# Patient Record
Sex: Female | Born: 1985 | Race: White | Hispanic: Yes | Marital: Married | Smoking: Never smoker
Health system: Southern US, Community
[De-identification: ages and names within clinical notes are randomized; demographics above are authoritative.]

## PROBLEM LIST (undated history)

## (undated) DIAGNOSIS — E669 Obesity, unspecified: Secondary | ICD-10-CM

## (undated) DIAGNOSIS — E559 Vitamin D deficiency, unspecified: Secondary | ICD-10-CM

## (undated) DIAGNOSIS — O24419 Gestational diabetes mellitus in pregnancy, unspecified control: Secondary | ICD-10-CM

## (undated) DIAGNOSIS — Z789 Other specified health status: Secondary | ICD-10-CM

## (undated) HISTORY — DX: Vitamin D deficiency, unspecified: E55.9

## (undated) HISTORY — DX: Obesity, unspecified: E66.9

## (undated) HISTORY — DX: Other specified health status: Z78.9

## (undated) HISTORY — PX: DILATION AND CURETTAGE OF UTERUS: SHX78

---

## 2019-05-08 NOTE — L&D Delivery Note (Signed)
Delivery Note At  a viable female was delivered via  (Presentation:OA      ).  APGAR:9 ,9 ; weight pending  .   Placenta status:complete  ,  . 3V Cord:   with the following complications: None  .   Anesthesia:  Epidural Episiotomy:  None Lacerations:  None Suture Repair: NA Est. Blood Loss (mL):    Mom to postpartum.  Baby to Couplet care / Skin to Skin.  Pamela Riggs 04/28/2020, 11:53 AM

## 2020-01-21 ENCOUNTER — Other Ambulatory Visit: Payer: Self-pay

## 2020-01-27 ENCOUNTER — Other Ambulatory Visit: Payer: Self-pay | Admitting: Obstetrics and Gynecology

## 2020-01-27 DIAGNOSIS — Z363 Encounter for antenatal screening for malformations: Secondary | ICD-10-CM

## 2020-02-05 ENCOUNTER — Encounter: Payer: Self-pay | Admitting: *Deleted

## 2020-02-09 ENCOUNTER — Ambulatory Visit: Payer: Medicaid Other

## 2020-02-09 ENCOUNTER — Other Ambulatory Visit: Payer: Self-pay | Admitting: *Deleted

## 2020-02-09 ENCOUNTER — Ambulatory Visit: Payer: Medicaid Other | Attending: Obstetrics and Gynecology

## 2020-02-09 ENCOUNTER — Other Ambulatory Visit: Payer: Self-pay

## 2020-02-09 ENCOUNTER — Ambulatory Visit: Payer: Medicaid Other | Admitting: *Deleted

## 2020-02-09 VITALS — BP 126/72 | HR 85 | Ht 62.0 in

## 2020-02-09 DIAGNOSIS — Z363 Encounter for antenatal screening for malformations: Secondary | ICD-10-CM | POA: Insufficient documentation

## 2020-02-09 DIAGNOSIS — O280 Abnormal hematological finding on antenatal screening of mother: Secondary | ICD-10-CM | POA: Diagnosis present

## 2020-02-09 DIAGNOSIS — Z362 Encounter for other antenatal screening follow-up: Secondary | ICD-10-CM

## 2020-02-10 ENCOUNTER — Other Ambulatory Visit: Payer: Self-pay

## 2020-02-11 ENCOUNTER — Ambulatory Visit: Payer: Self-pay | Admitting: Obstetrics and Gynecology

## 2020-02-11 ENCOUNTER — Other Ambulatory Visit: Payer: Self-pay

## 2020-02-11 ENCOUNTER — Ambulatory Visit: Payer: Medicaid Other | Attending: Obstetrics and Gynecology

## 2020-02-11 DIAGNOSIS — O281 Abnormal biochemical finding on antenatal screening of mother: Secondary | ICD-10-CM | POA: Diagnosis not present

## 2020-02-11 NOTE — Progress Notes (Signed)
Referring provider:  [facility] Length of consultation:  30 minutes  [name] was referred for genetic counseling to review the results of Panorama cell free fetal DNA testing which revealed atypical fndings.  The counseling was provided by telephone, and I verified that I am speaking with the correct person using two identifiers. I discussed the limitations of evaluation and management by telemedicine. The patient expressed understanding and agreed to proceed.  [name] had Panorama Non-Invasive Prenatal Screening (NIPS) through [facility] that demonstrated an atypical finding on the sex chromosomes suspected to be maternal in origin. We reviewed that NIPS analyzes cell-free fetal DNA from the placenta found in the maternal bloodstream during pregnancy. Therefore, the sample examined contains both maternal and placental DNA.  When there is a difference in the maternal chromosomes, it may impede the interpretation of results specific to the pregnancy.  Based on the report from this sample, we discussed that there are several possibilities to explain her atypical NIPS result including a numeric or structural sex chromosome condition in the patient, mosaicism for a sex chromosome difference in the patient, copy number variant in the patient or a normal variation.  In order to fully assess these possibilities, we would recommend a chromosome analysis on maternal blood with a reflex to maternal chromosomal microarray if those results are normal.  This could more fully determine if there is a risk of a clinically significant chromosome difference in this pregnancy or other family members.  Parents with chromosome differences may be at increased risk to pass those on to a child, but that chance and any possible health or developmental concerns would be dependent upon the exact findings in the parent.    In addition, we reviewed the option of amniocentesis in this pregnancy for chromosome analysis and  chromosomal microarray.  However, interpreting this type of testing would be more informative once the results of maternal testing become available.  [name] declined amniocentesis at this time and plans to have her blood drawn for chromosome analysis with reflex to microarray at her next visit to our clinic.  We also obtained a detailed family history and pregnancy history.  This is the third pregnancy for the patient and her partner.  They have a healthy 81 year old son with normal development.  She also had a 12 week miscarriage. The patient has a healthy sister who has three sons who are developing normally and had one miscarriage. She has a full brother who is 54 years old with developmental disabilities.  He was able to attend school, but cannot live or work independently.  This brother was born about a month early and has no birth defects or health concerns. The mother states that his delays are the result of lack of care at birth, however, the patient is not aware of any medical/genetic testing to help determine the cause.  The patient also had two other brothers, one who was stillborn and one who passed away soon after birth.  It is unclear the cause for these losses, as this is not a subject that her mother is willing to discuss.  Of note, the patient's mother is one of approximately 12 siblings.  There may have also been pregnancy losses in this generation, but no details are known.  There is one female maternal first cousin with possible developmental delays, though he is able to live independently. Our patient recalls her mother stating that she may have "something that she could pass on to girls"  however, no details on this are known. The history of pregnancy losses and developmental delays could certainly be suggestive of a chromosome translocation or other condition in the family, though chromosome testing on [name] will provide more clarity on this. If any additional medical information is  available, we are happy to review this as well.  [name] had an ultrasound at [facility] on 02/09/20 at [redacted] weeks gestation.  No anomalies were noted at that time and the fetal gender appeared to be female. She will return to this clinic for another ultrasound on 03/08/20 and will have labs drawn at that time for Chromosome analysis with reflex to microarray.  We appreciate being involved in the care of this patient and may be reached at [phone] with any questions.  Cherly Anderson, MS, CGC

## 2020-03-08 ENCOUNTER — Encounter: Payer: Self-pay | Admitting: *Deleted

## 2020-03-08 ENCOUNTER — Ambulatory Visit: Payer: Medicaid Other | Attending: Obstetrics and Gynecology

## 2020-03-08 ENCOUNTER — Ambulatory Visit: Payer: Medicaid Other | Admitting: *Deleted

## 2020-03-08 ENCOUNTER — Other Ambulatory Visit: Payer: Self-pay

## 2020-03-08 VITALS — BP 102/73 | HR 96

## 2020-03-08 DIAGNOSIS — Z6841 Body Mass Index (BMI) 40.0 and over, adult: Secondary | ICD-10-CM | POA: Diagnosis present

## 2020-03-08 DIAGNOSIS — Z3A30 30 weeks gestation of pregnancy: Secondary | ICD-10-CM

## 2020-03-08 DIAGNOSIS — Z362 Encounter for other antenatal screening follow-up: Secondary | ICD-10-CM | POA: Insufficient documentation

## 2020-03-08 DIAGNOSIS — Z363 Encounter for antenatal screening for malformations: Secondary | ICD-10-CM

## 2020-03-08 DIAGNOSIS — Z8489 Family history of other specified conditions: Secondary | ICD-10-CM

## 2020-03-08 DIAGNOSIS — O285 Abnormal chromosomal and genetic finding on antenatal screening of mother: Secondary | ICD-10-CM

## 2020-03-08 DIAGNOSIS — O24410 Gestational diabetes mellitus in pregnancy, diet controlled: Secondary | ICD-10-CM

## 2020-03-08 DIAGNOSIS — O99210 Obesity complicating pregnancy, unspecified trimester: Secondary | ICD-10-CM | POA: Diagnosis not present

## 2020-03-09 ENCOUNTER — Other Ambulatory Visit: Payer: Self-pay | Admitting: *Deleted

## 2020-03-09 DIAGNOSIS — O24419 Gestational diabetes mellitus in pregnancy, unspecified control: Secondary | ICD-10-CM

## 2020-03-18 LAB — CHROMOSOME RFX TO MICROARRAY
Cells Analyzed: 20
Cells Counted: 30
Cells Karyotyped: 2
GTG Band Resolution Achieved: 550

## 2020-03-21 ENCOUNTER — Telehealth: Payer: Self-pay | Admitting: Obstetrics and Gynecology

## 2020-03-21 NOTE — Telephone Encounter (Signed)
I spoke with [name] today with the results of her chromosome analysis which were drawn due to a suspected maternal finding on the Panorama cell free fetal DNA testing.  The results were reported as follows: 45,X [12]/46,XX[18].  This means that 60% of the cells examined showed normal female karyotype while 40% of the cells showed a missing sex chromosome.  This is consistent with mosaic Turner syndrome.    We reviewed that some natural loss of an X chromosome is normal as women age, but that this is expected to be present in less than 10% of cells.  When a higher percentage is noted, the diagnosis is consistent with mosaicism.  Persons with mosaic Turner syndrome may have a highly variable phenotype, from features consistent with Turner syndrome to no findings of the condition.  This is dependent upon how many cells and in what tissues the abnormal cell line is found. Features of Turner syndrome include short stature, infertility due to gonadal dysgenesis/premature ovarian failure, webbed neck, low hairline, broad chest, cardiac anomalies, kidney malformations and learning differences. Testing another cell type, such as a buccal swab, may provide additional information.  As for this pregnancy, which appears to be female by ultrasound, we would expect a low chance for a sex chromosome condition.  However, women with mosaicism involving the gonadal tissue may be at increased risk to have a female fetus with Turner syndrome. Greater than 98% of pregnancies conceived with Turner syndrome result in miscarriage, and we cannot comment on the possible karyotype of the documented pregnancy loss for [name] or the reported other two positive pregnancy tests that were followed by heavy bleeding prior to medical confirmation of pregnancy. Of note, [name] stated that she is 5'2 (her sister is 20'4, mother is 53'5 and father is 5'7).  She had normal onset of periods in middle school with no learning differences.  During  her pregnancy with her son, she was referred for a cardiology evaluation due to chest pain and the evaluation was normal per her report.    I also inquired if she learned anything further about the condition her mother told her "she may pass on to girls" and she reported that she spoke with her sister, who said that the sister and their mother both have "2 chromosomes that were switched" and could cause problems in a baby.  It is not clear what brought her sister to have this testing and the patient does not recall which chromosomes were involved.  She plans to speak with her sister and try to obtain documentation of the change so that we could consider additional testing if needed.  No other numeric or structural chromosome conditions were reported on the recent chromosome analysis, but we have requested that the lab retain the sample until more is learned about this history of a possible chromosome translocation.  I will speak with Medical Genetics at the [facility] meeting tomorrow regarding recommendations for follow up evaluations for this patient and will let her know the plans after that.  We may be reached at [phone].  Pamela Anderson, MS, CGC

## 2020-03-22 ENCOUNTER — Encounter: Payer: Self-pay | Admitting: Obstetrics and Gynecology

## 2020-04-05 ENCOUNTER — Ambulatory Visit: Payer: Medicaid Other

## 2020-04-07 ENCOUNTER — Telehealth: Payer: Self-pay | Admitting: Obstetrics and Gynecology

## 2020-04-07 NOTE — Telephone Encounter (Signed)
I spoke with Ms. Leticia Clas to follow up on the possible chromosome translocation she mentioned in her sister.  She stated today that she asked and the sister thought it may "have involved chromosomes 8, 16 or 19". I explained that we would be happy to send her a record release to obtain documentation of these lab results, but she also stated that the sister didn't remember where she was seen for that testing.  We agreed that the patient would contact our clinic if she desires for [place] to provide a form to obtain records on her sister.  I also explained that while her chromosome analysis did not show evidence of a structural chromosome rearrangement, this cannot always be identified without additional testing.  Lastly, I let the patient know that we will be sending records to [facility] for them to contact her for an appointment in the [facility] with Dr. Abelino Derrick in the near future.    We may be reached at [phone].  Cherly Anderson, MS, CGC

## 2020-04-13 ENCOUNTER — Telehealth: Payer: Self-pay

## 2020-04-13 NOTE — Telephone Encounter (Signed)
Spoke with patient and she cancelled her upcoming follow up and bpp appointments. She stated that her OB said that she didn't need them anymore.   Routing to MD's to make aware.

## 2020-04-15 ENCOUNTER — Ambulatory Visit: Payer: Medicaid Other

## 2020-04-19 ENCOUNTER — Ambulatory Visit: Payer: Medicaid Other

## 2020-04-25 ENCOUNTER — Other Ambulatory Visit: Payer: Self-pay

## 2020-04-25 ENCOUNTER — Encounter (HOSPITAL_COMMUNITY): Payer: Self-pay | Admitting: Obstetrics and Gynecology

## 2020-04-25 ENCOUNTER — Inpatient Hospital Stay (HOSPITAL_COMMUNITY)
Admission: AD | Admit: 2020-04-25 | Discharge: 2020-04-29 | DRG: 807 | Disposition: A | Discharge status: Home / Self Care | Payer: Medicaid Other | Attending: Obstetrics & Gynecology | Admitting: Obstetrics & Gynecology

## 2020-04-25 DIAGNOSIS — Z23 Encounter for immunization: Secondary | ICD-10-CM | POA: Diagnosis not present

## 2020-04-25 DIAGNOSIS — F419 Anxiety disorder, unspecified: Secondary | ICD-10-CM | POA: Diagnosis present

## 2020-04-25 DIAGNOSIS — Z3A37 37 weeks gestation of pregnancy: Secondary | ICD-10-CM | POA: Diagnosis not present

## 2020-04-25 DIAGNOSIS — O99214 Obesity complicating childbirth: Secondary | ICD-10-CM | POA: Diagnosis present

## 2020-04-25 DIAGNOSIS — O99213 Obesity complicating pregnancy, third trimester: Secondary | ICD-10-CM | POA: Diagnosis not present

## 2020-04-25 DIAGNOSIS — Z3490 Encounter for supervision of normal pregnancy, unspecified, unspecified trimester: Secondary | ICD-10-CM | POA: Diagnosis present

## 2020-04-25 DIAGNOSIS — Z20822 Contact with and (suspected) exposure to covid-19: Secondary | ICD-10-CM | POA: Diagnosis present

## 2020-04-25 DIAGNOSIS — O1214 Gestational proteinuria, complicating childbirth: Secondary | ICD-10-CM | POA: Diagnosis present

## 2020-04-25 DIAGNOSIS — Z3A36 36 weeks gestation of pregnancy: Secondary | ICD-10-CM

## 2020-04-25 DIAGNOSIS — O1490 Unspecified pre-eclampsia, unspecified trimester: Secondary | ICD-10-CM

## 2020-04-25 DIAGNOSIS — O99344 Other mental disorders complicating childbirth: Secondary | ICD-10-CM | POA: Diagnosis present

## 2020-04-25 DIAGNOSIS — Q963 Mosaicism, 45, X/46, XX or XY: Secondary | ICD-10-CM

## 2020-04-25 DIAGNOSIS — R112 Nausea with vomiting, unspecified: Secondary | ICD-10-CM | POA: Diagnosis present

## 2020-04-25 DIAGNOSIS — O24425 Gestational diabetes mellitus in childbirth, controlled by oral hypoglycemic drugs: Principal | ICD-10-CM | POA: Diagnosis present

## 2020-04-25 DIAGNOSIS — O212 Late vomiting of pregnancy: Secondary | ICD-10-CM | POA: Diagnosis present

## 2020-04-25 DIAGNOSIS — O24435 Gestational diabetes mellitus in puerperium, controlled by oral hypoglycemic drugs: Secondary | ICD-10-CM | POA: Diagnosis present

## 2020-04-25 DIAGNOSIS — O1213 Gestational proteinuria, third trimester: Secondary | ICD-10-CM | POA: Diagnosis present

## 2020-04-25 DIAGNOSIS — O24415 Gestational diabetes mellitus in pregnancy, controlled by oral hypoglycemic drugs: Secondary | ICD-10-CM | POA: Diagnosis not present

## 2020-04-25 LAB — COMPREHENSIVE METABOLIC PANEL
ALT: 14 U/L (ref 0–44)
AST: 18 U/L (ref 15–41)
Albumin: 2.8 g/dL — ABNORMAL LOW (ref 3.5–5.0)
Alkaline Phosphatase: 124 U/L (ref 38–126)
Anion gap: 14 (ref 5–15)
BUN: 6 mg/dL (ref 6–20)
CO2: 16 mmol/L — ABNORMAL LOW (ref 22–32)
Calcium: 9.1 mg/dL (ref 8.9–10.3)
Chloride: 103 mmol/L (ref 98–111)
Creatinine, Ser: 0.81 mg/dL (ref 0.44–1.00)
GFR, Estimated: >60 mL/min (ref >60)
Glucose, Bld: 92 mg/dL (ref 70–99)
Potassium: 4.3 mmol/L (ref 3.5–5.1)
Sodium: 133 mmol/L — ABNORMAL LOW (ref 135–145)
Total Bilirubin: 1.4 mg/dL — ABNORMAL HIGH (ref 0.3–1.2)
Total Protein: 6.9 g/dL (ref 6.5–8.1)

## 2020-04-25 LAB — CBC
HCT: 38.9 % (ref 36.0–46.0)
HCT: 35.1 % — ABNORMAL LOW (ref 36.0–46.0)
Hemoglobin: 13.3 g/dL (ref 12.0–15.0)
Hemoglobin: 11.7 g/dL — ABNORMAL LOW (ref 12.0–15.0)
MCH: 30.5 pg (ref 26.0–34.0)
MCH: 29.7 pg (ref 26.0–34.0)
MCHC: 34.2 g/dL (ref 30.0–36.0)
MCHC: 33.3 g/dL (ref 30.0–36.0)
MCV: 89.2 fL (ref 80.0–100.0)
MCV: 89.1 fL (ref 80.0–100.0)
Platelets: 218 10*3/uL (ref 150–400)
Platelets: 176 10*3/uL (ref 150–400)
RBC: 4.36 MIL/uL (ref 3.87–5.11)
RBC: 3.94 MIL/uL (ref 3.87–5.11)
RDW: 13.1 % (ref 11.5–15.5)
RDW: 13.1 % (ref 11.5–15.5)
WBC: 10.8 10*3/uL — ABNORMAL HIGH (ref 4.0–10.5)
WBC: 9.2 10*3/uL (ref 4.0–10.5)
nRBC: 0 % (ref 0.0–0.2)
nRBC: 0 % (ref 0.0–0.2)

## 2020-04-25 LAB — URINALYSIS, ROUTINE W REFLEX MICROSCOPIC
Bacteria, UA: NONE SEEN
Bilirubin Urine: NEGATIVE
Glucose, UA: NEGATIVE mg/dL
Hgb urine dipstick: NEGATIVE
Ketones, ur: 80 mg/dL — AB
Nitrite: NEGATIVE
Protein, ur: 100 mg/dL — AB
Specific Gravity, Urine: 1.02 (ref 1.005–1.030)
pH: 5 (ref 5.0–8.0)

## 2020-04-25 LAB — TYPE AND SCREEN
ABO/RH(D): A POS
Antibody Screen: NEGATIVE

## 2020-04-25 LAB — RESP PANEL BY RT-PCR (FLU A&B, COVID) ARPGX2
Influenza A by PCR: NEGATIVE
Influenza B by PCR: NEGATIVE
SARS Coronavirus 2 by RT PCR: NEGATIVE

## 2020-04-25 LAB — GLUCOSE, CAPILLARY
Glucose-Capillary: 90 mg/dL (ref 70–99)
Glucose-Capillary: 92 mg/dL (ref 70–99)
Glucose-Capillary: 85 mg/dL (ref 70–99)

## 2020-04-25 LAB — PROTEIN / CREATININE RATIO, URINE
Creatinine, Urine: 163.72 mg/dL
Protein Creatinine Ratio: 0.48 mg/mg{Cre} — ABNORMAL HIGH (ref 0.00–0.15)
Total Protein, Urine: 79 mg/dL

## 2020-04-25 LAB — BETA-HYDROXYBUTYRIC ACID: Beta-Hydroxybutyric Acid: 2.62 mmol/L — ABNORMAL HIGH (ref 0.05–0.27)

## 2020-04-25 LAB — GROUP B STREP BY PCR: Group B strep by PCR: NEGATIVE

## 2020-04-25 MED ORDER — PROMETHAZINE HCL 25 MG/ML IJ SOLN
12.5000 mg | Freq: Once | INTRAMUSCULAR | Status: AC
  Administered 2020-04-25: 15:00:00 12.5 mg via INTRAVENOUS
  Filled 2020-04-25: qty 1

## 2020-04-25 MED ORDER — PRENATAL MULTIVITAMIN CH
1.0000 | ORAL_TABLET | Freq: Every day | ORAL | Status: DC
  Administered 2020-04-26 – 2020-04-27 (×2): 1 via ORAL
  Filled 2020-04-25 (×2): qty 1

## 2020-04-25 MED ORDER — DEXTROSE IN LACTATED RINGERS 5 % IV SOLN: INTRAVENOUS | Status: DC

## 2020-04-25 MED ORDER — LACTATED RINGERS IV BOLUS
1000.0000 mL | Freq: Once | INTRAVENOUS | Status: AC
  Administered 2020-04-25: 14:00:00 1000 mL via INTRAVENOUS

## 2020-04-25 MED ORDER — PROMETHAZINE HCL 25 MG/ML IJ SOLN: 25.0000 mg | Freq: Three times a day (TID) | INTRAMUSCULAR | Status: DC | PRN

## 2020-04-25 MED ORDER — CALCIUM CARBONATE ANTACID 500 MG PO CHEW
2.0000 | CHEWABLE_TABLET | ORAL | Status: DC | PRN
Start: 2020-04-25 — End: 2020-04-27

## 2020-04-25 MED ORDER — DOCUSATE SODIUM 100 MG PO CAPS
100.0000 mg | ORAL_CAPSULE | Freq: Every day | ORAL | Status: DC
  Filled 2020-04-25 (×2): qty 1

## 2020-04-25 MED ORDER — LACTATED RINGERS IV SOLN: INTRAVENOUS | Status: DC

## 2020-04-25 MED ORDER — LACTATED RINGERS IV BOLUS
1000.0000 mL | Freq: Once | INTRAVENOUS | Status: AC
  Administered 2020-04-25: 15:00:00 1000 mL via INTRAVENOUS

## 2020-04-25 MED ORDER — ZOLPIDEM TARTRATE 5 MG PO TABS: 5.0000 mg | ORAL_TABLET | Freq: Every evening | ORAL | Status: DC | PRN

## 2020-04-25 MED ORDER — ACETAMINOPHEN 325 MG PO TABS: 650.0000 mg | ORAL_TABLET | ORAL | Status: DC | PRN

## 2020-04-25 NOTE — H&P (Addendum)
OB ADMISSION/ HISTORY & PHYSICAL:  Admission Date: 04/25/2020 12:48 PM  Admit Diagnosis: Proteinurua  Pamela Riggs is a 34 y.o. female G3P1011 [redacted]w[redacted]d presenting for N/V since 04/22/20, reports emesis after every attempt at eating/drinking. Unable to tolerate solids/liquids. C/O abd pain after multiple episodes of vomiting. Reports active FM and ctx when standing, denies LOF and vaginal bleeding. 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   History of current pregnancy: G3P1011   Patient entered care with [facility] at 23+2 wks as a tx from [facility] in [place].   EDC 05/17/20 by 07/15/20 @ 6+2 wks Anatomy scan:  26 wks, complete w/ posterior placenta.   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, XX or XY 04/25/2020    Prenatal Labs: ABO, Rh:  A positive Antibody:  negative Rubella:   immune RPR:   NR HBsAg:   NR HIV:   NR GTT: 1 hr 250 GBS:   negativeGC/CHL: neg/neg Genetics: Panorama atypical sex chromosome Tdap/influenza vaccines: declined/declined   OB History  Gravida Para Term Preterm AB Living  3 1 1   1 1   SAB IAB Ectopic Multiple Live Births  1            # Outcome Date GA Lbr Len/2nd Weight Sex Delivery Anes PTL Lv  3 Current           2 Term 07/29/16 [redacted]w[redacted]d    Vag-Spont     1 SAB             Medical / Surgical History: Past medical history:  Past Medical History:  Diagnosis Date   Medical history non-contributory     Past surgical history:  Past Surgical History:  Procedure Laterality Date   DILATION AND CURETTAGE  OF UTERUS     Family History: History reviewed. No pertinent family history.  Social History:  reports that she has never smoked. She has never used smokeless tobacco. She reports that she does not drink alcohol and does not use drugs.  Allergies: Patient has no known allergies.   Current Medications at time of admission:  Prior to Admission medications   Medication Sig Start Date End Date Taking? Authorizing Provider  Prenatal Vit-Fe Fumarate-FA (PRENATAL VITAMINS PO) Take by mouth.   Yes [provider]  glyBURIDE (DIABETA) 5 MG tablet Take 5 mg by mouth 2 (two) times daily with a meal.    [provider]    Review of Systems: Constitutional: Negative   HENT: Negative   Eyes: Negative   Respiratory: Negative   Cardiovascular: Negative   Gastrointestinal: Negative  Genitourinary: neg for bloody show, neg for LOF   Musculoskeletal: Negative   Skin: Negative   Neurological: Negative   Endo/Heme/Allergies: Negative   Psychiatric/Behavioral: Negative    Physical Exam: VS: Blood pressure 132/79, pulse (!) 102, temperature 98.7 F (37.1 C), temperature source Oral, resp. rate (!) 24, height 5\' 2"  (1.575 m), weight 108.2 kg, last menstrual period 08/02/2019, SpO2 100 %. AAO x3, no signs of distress Cardiovascular: RRR Respiratory: Lung fields clear to ausculation GU/GI: Abdomen  gravid, non-tender, non-distended, active FM, vertex Extremities: trace edema, negative for pain, tenderness, and cords  Cervical exam:Dilation: Closed Effacement (%): Thick Exam by:: Pamela Riggs, CNM FHR: baseline rate 140 / variability moderate / accelerations present / absent decelerations TOCO: irreg   Prenatal Transfer Tool  Maternal Diabetes: Yes:  Diabetes Type:  Insulin/Medication controlled Genetic Screening: Abnormal:  Results: Other: Panorama-atypical sex chromosome Maternal Ultrasounds/Referrals: Normal Fetal Ultrasounds or other Referrals:  Fetal echo normal  fetal cardiac anatomy and function. Recommended fetal EGC after delivery if irregular FHR noted after birth Maternal Substance Abuse:  No Significant Maternal Medications:  Meds include: Other: Glyburide 5mg  PO BID Significant Maternal Lab Results: Other: GBS pending    Assessment: 34 y.o. G3P1011 [redacted]w[redacted]d  Proteinuria A2DM N/V in third trimester FHR category 1 GBS negative  Plan:  Admit to [facility] Routine antenatal admission orders CBG-fasting and PP 24 hr urine Advance diet as tolerate Phenergan IV PRN Glyburide 5mg  PO BID when tolerating PO NST TID  Dr [facility] notified of admission and plan of care  MSN, CNM 04/25/2020 5:58 PM

## 2020-04-25 NOTE — MAU Provider Note (Signed)
History     CSN: [id number]  Arrival date and time: 04/25/20 1248   Event Date/Time   First Provider Initiated Contact with Patient 04/25/20 1335      Chief Complaint  Patient presents with  . Abdominal Pain  . Nausea  . Emesis   HPI Pamela Riggs is 34 y.o. G3P1011 at [redacted]w[redacted]d who presents to MAU with chief complaints of nausea and vomiting in the setting of ADGDM on Glyburide BID.  She states she woke up Friday morning 04/22/2020 and experienced new onset nausea and vomiting while brushing her teeth. Throughout the weekend she was unable to tolerate anything PO and so did not take her Glyburide. She denies vaginal bleeding, leaking of fluid, decreased fetal movement, fever, falls, or recent illness.   Patient also c/o pelvic pain and lower abdominal cramping. These are recurrent problems, aggravated by movement and adopting a sitting position. She denies alleviating factors. She declines pain medication in MAU.  She denies history of elevated blood pressures. She denies headache, visual disturbances, RUQ/epigastric pain, new onset swelling or weight gain.  Patient receives care with [facility].  OB History    Gravida  3   Para  1   Term  1   Preterm      AB  1   Living  1     SAB  1   IAB      Ectopic      Multiple      Live Births              Past Medical History:  Diagnosis Date  . Medical history non-contributory     Past Surgical History:  Procedure Laterality Date  . DILATION AND CURETTAGE OF UTERUS      No family history on file.  Social History   Tobacco Use  . Smoking status: Never Smoker  . Smokeless tobacco: Never Used  Vaping Use  . Vaping Use: Never used  Substance Use Topics  . Alcohol use: Never  . Drug use: Never    Allergies: No Known Allergies  Medications Prior to Admission  Medication Sig Dispense Refill Last Dose  . Prenatal Vit-Fe Fumarate-FA (PRENATAL VITAMINS PO) Take by mouth.   04/25/2020 at Unknown time  .  glyBURIDE (DIABETA) 5 MG tablet Take 5 mg by mouth 2 (two) times daily with a meal.       Review of Systems  Gastrointestinal: Positive for abdominal pain.  Genitourinary: Positive for pelvic pain. Negative for vaginal bleeding.  All other systems reviewed and are negative.  Physical Exam   Blood pressure (!) 145/91, pulse (!) 120, temperature 98.4 F (36.9 C), temperature source Oral, resp. rate 18, height 5\' 2"  (1.575 m), weight 108.2 kg, last menstrual period 08/02/2019, SpO2 100 %.  Physical Exam Vitals and nursing note reviewed. Exam conducted with a chaperone present.  Cardiovascular:     Rate and Rhythm: Normal rate.     Heart sounds: Normal heart sounds.  Pulmonary:     Effort: Pulmonary effort is normal.     Breath sounds: Normal breath sounds.  Abdominal:     Tenderness: There is no abdominal tenderness.     Comments: Gravid  Skin:    General: Skin is warm.     Capillary Refill: Capillary refill takes less than 2 seconds.  Neurological:     Mental Status: She is alert and oriented to person, place, and time.  Psychiatric:        Mood and Affect:  Mood normal.        Behavior: Behavior normal.     MAU Course  Procedures   --Reactive tracing: baseline 140, mod var, + 15 x 15 accels, no decels --Toco: quiet --Cervix closed --Random POCT CBG of 90 on arrival --Premedicated with Phenergan, tolerating juice, CBG one hour later 92 --Management in MAU coordinated with Dr. Adrian Blackwater, admission advised. Report called to Dr. Normand Sloop, who agrees with recommendation  Orders Placed This Encounter  Procedures  . Urinalysis, Routine w reflex microscopic Urine, Clean Catch  . CBC  . Comprehensive metabolic panel  . Beta-hydroxybutyric acid  . Glucose, capillary  . Protein / creatinine ratio, urine  . Glucose, capillary  . Insert peripheral IV   Patient Vitals for the past 24 hrs:  BP Temp Temp src Pulse Resp SpO2 Height Weight  04/25/20 1546 122/72 -- -- 98 -- -- -- --   04/25/20 1531 (!) 130/99 -- -- 75 -- -- -- --  04/25/20 1516 119/69 -- -- 97 -- -- -- --  04/25/20 1501 122/68 -- -- 95 -- -- -- --  04/25/20 1446 116/71 -- -- 95 -- -- -- --  04/25/20 1431 128/82 -- -- (!) 105 -- -- -- --  04/25/20 1416 121/76 -- -- (!) 105 -- -- -- --  04/25/20 1400 (!) 113/52 -- -- (!) 101 -- 97 % -- --  04/25/20 1345 (!) 131/96 -- -- (!) 107 -- 99 % -- --  04/25/20 1328 112/75 -- -- (!) 129 -- -- -- --  04/25/20 1306 (!) 145/91 98.4 F (36.9 C) Oral (!) 120 18 100 % 5\' 2"  (1.575 m) 108.2 kg   Results for orders placed or performed during the hospital encounter of 04/25/20 (from the past 24 hour(s))  CBC     Status: Abnormal   Collection Time: 04/25/20  1:25 PM  Result Value Ref Range   WBC 10.8 (H) 4.0 - 10.5 K/uL   RBC 4.36 3.87 - 5.11 MIL/uL   Hemoglobin 13.3 12.0 - 15.0 g/dL   HCT 04/27/20 01.7 - 51.0 %   MCV 89.2 80.0 - 100.0 fL   MCH 30.5 26.0 - 34.0 pg   MCHC 34.2 30.0 - 36.0 g/dL   RDW 25.8 52.7 - 78.2 %   Platelets 218 150 - 400 K/uL   nRBC 0.0 0.0 - 0.2 %  Comprehensive metabolic panel     Status: Abnormal   Collection Time: 04/25/20  1:25 PM  Result Value Ref Range   Sodium 133 (L) 135 - 145 mmol/L   Potassium 4.3 3.5 - 5.1 mmol/L   Chloride 103 98 - 111 mmol/L   CO2 16 (L) 22 - 32 mmol/L   Glucose, Bld 92 70 - 99 mg/dL   BUN 6 6 - 20 mg/dL   Creatinine, Ser 04/27/20 0.44 - 1.00 mg/dL   Calcium 9.1 8.9 - 5.36 mg/dL   Total Protein 6.9 6.5 - 8.1 g/dL   Albumin 2.8 (L) 3.5 - 5.0 g/dL   AST 18 15 - 41 U/L   ALT 14 0 - 44 U/L   Alkaline Phosphatase 124 38 - 126 U/L   Total Bilirubin 1.4 (H) 0.3 - 1.2 mg/dL   GFR, Estimated 14.4 >31 mL/min   Anion gap 14 5 - 15  Beta-hydroxybutyric acid     Status: Abnormal   Collection Time: 04/25/20  1:25 PM  Result Value Ref Range   Beta-Hydroxybutyric Acid 2.62 (H) 0.05 - 0.27 mmol/L  Glucose, capillary  Status: None   Collection Time: 04/25/20  1:30 PM  Result Value Ref Range   Glucose-Capillary 90 70 -  99 mg/dL  Protein / creatinine ratio, urine     Status: Abnormal   Collection Time: 04/25/20  1:46 PM  Result Value Ref Range   Creatinine, Urine 163.72 mg/dL   Total Protein, Urine 79 mg/dL   Protein Creatinine Ratio 0.48 (H) 0.00 - 0.15 mg/mg[Cre]  Urinalysis, Routine w reflex microscopic Urine, Clean Catch     Status: Abnormal   Collection Time: 04/25/20  1:48 PM  Result Value Ref Range   Color, Urine AMBER (A) YELLOW   APPearance HAZY (A) CLEAR   Specific Gravity, Urine 1.020 1.005 - 1.030   pH 5.0 5.0 - 8.0   Glucose, UA NEGATIVE NEGATIVE mg/dL   Hgb urine dipstick NEGATIVE NEGATIVE   Bilirubin Urine NEGATIVE NEGATIVE   Ketones, ur 80 (A) NEGATIVE mg/dL   Protein, ur 761 (A) NEGATIVE mg/dL   Nitrite NEGATIVE NEGATIVE   Leukocytes,Ua TRACE (A) NEGATIVE   RBC / HPF 0-5 0 - 5 RBC/hpf   WBC, UA 6-10 0 - 5 WBC/hpf   Bacteria, UA NONE SEEN NONE SEEN   Squamous Epithelial / LPF 11-20 0 - 5   Mucus PRESENT    Hyaline Casts, UA PRESENT   Glucose, capillary     Status: None   Collection Time: 04/25/20  4:24 PM  Result Value Ref Range   Glucose-Capillary 92 70 - 99 mg/dL    Assessment and Plan  --34 y.o. G3P1011 at [redacted]w[redacted]d  --Proteinuria in the setting of new onset HTN --A2GDM on Glyburide 5mg  BID --Reactive tracing, closed cervix --Per Dr. , admit to [facility], CNM 04/25/2020, 5:01 PM

## 2020-04-25 NOTE — MAU Note (Signed)
This weekend, hadn't felt very good.  Hasn't been able to eat, had cereal Sat morning- was only thing she has kept down.  Ongoing nausea and vomiting.  Having pain in lower abd, pressure. Pain wakes her up every 2 hrs, gets too uncomfortable. Denies fever or diarrhea.  Contracts when she sits down- more regular when sitting.

## 2020-04-26 LAB — URINE CULTURE

## 2020-04-26 LAB — GLUCOSE, CAPILLARY
Glucose-Capillary: 76 mg/dL (ref 70–99)
Glucose-Capillary: 77 mg/dL (ref 70–99)
Glucose-Capillary: 77 mg/dL (ref 70–99)

## 2020-04-26 LAB — PROTEIN, URINE, 24 HOUR
Collection Interval-UPROT: 24 hours
Protein, 24H Urine: 462 mg/d — ABNORMAL HIGH (ref 50–100)
Protein, Urine: 22 mg/dL
Urine Total Volume-UPROT: 2100 mL

## 2020-04-26 NOTE — Progress Notes (Signed)
Hospital day # 1 pregnancy at [redacted]w[redacted]d--GDMA2  S:  Pt feeling better.  Denies vomiting.  Has only tried eating soup.  Is not taking Glyburide in hospital and is having random normal sugars.  Denies headache, blurred vision and epigastric pain.  FM+, Denies contractions or LOF    O: BP 121/66 (BP Location: Right Arm)    Pulse 96    Temp 98 F (36.7 C) (Oral)    Resp 18    Ht 5\' 2"  (1.575 m)    Wt 108.2 kg    LMP 08/02/2019 (Approximate)    SpO2 100%    BMI 43.64 kg/m   Vitals with BMI 04/26/2020 04/26/2020 04/26/2020  Height - - -  Weight - - -  BMI - - -  Systolic 121 116 04/28/2020  Diastolic 66 79 75  Pulse 96 89 89        Fetal tracings: FHT 130s accels noted, no decels.      Contractions:  irregular      Uterus gravid and non-tender      Extremities: extremities normal, atraumatic, no cyanosis or edema and no significant edema and no signs of DVT          Labs:   Results for orders placed or performed during the hospital encounter of 04/25/20 (from the past 24 hour(s))  Glucose, capillary     Status: None   Collection Time: 04/25/20  8:35 PM  Result Value Ref Range   Glucose-Capillary 85 70 - 99 mg/dL  Glucose, capillary     Status: None   Collection Time: 04/26/20 12:52 AM  Result Value Ref Range   Glucose-Capillary 77 70 - 99 mg/dL  Glucose, capillary     Status: None   Collection Time: 04/26/20  4:46 AM  Result Value Ref Range   Glucose-Capillary 77 70 - 99 mg/dL  Glucose, capillary     Status: None   Collection Time: 04/26/20  8:57 AM  Result Value Ref Range   Glucose-Capillary 76 70 - 99 mg/dL   24 hour urine for protein pending       Meds: Glyburide 5 mg BID on hold  A: [redacted]w[redacted]d with GDMA2 and proteinuria     stable  P: Continue current plan of care      Upcoming tests/treatments:  MFM consult in am for poc       MDs will follow  [redacted]w[redacted]d CNM, MSN 04/26/2020 7:41 PM

## 2020-04-26 NOTE — Plan of Care (Signed)
  Problem: Education: Goal: Knowledge of General Education information will improve Description: Including pain rating scale, medication(s)/side effects and non-pharmacologic comfort measures Outcome: Completed/Met

## 2020-04-27 ENCOUNTER — Inpatient Hospital Stay (HOSPITAL_COMMUNITY): Payer: Medicaid Other

## 2020-04-27 ENCOUNTER — Inpatient Hospital Stay (HOSPITAL_COMMUNITY): Payer: Medicaid Other | Admitting: Anesthesiology

## 2020-04-27 DIAGNOSIS — O24415 Gestational diabetes mellitus in pregnancy, controlled by oral hypoglycemic drugs: Secondary | ICD-10-CM

## 2020-04-27 DIAGNOSIS — Z3A37 37 weeks gestation of pregnancy: Secondary | ICD-10-CM

## 2020-04-27 DIAGNOSIS — Z3490 Encounter for supervision of normal pregnancy, unspecified, unspecified trimester: Secondary | ICD-10-CM | POA: Diagnosis present

## 2020-04-27 DIAGNOSIS — O99213 Obesity complicating pregnancy, third trimester: Secondary | ICD-10-CM

## 2020-04-27 LAB — CBC
HCT: 35.6 % — ABNORMAL LOW (ref 36.0–46.0)
Hemoglobin: 12.4 g/dL (ref 12.0–15.0)
MCH: 31.1 pg (ref 26.0–34.0)
MCHC: 34.8 g/dL (ref 30.0–36.0)
MCV: 89.2 fL (ref 80.0–100.0)
Platelets: 203 10*3/uL (ref 150–400)
RBC: 3.99 MIL/uL (ref 3.87–5.11)
RDW: 13.2 % (ref 11.5–15.5)
WBC: 5.8 10*3/uL (ref 4.0–10.5)
nRBC: 0 % (ref 0.0–0.2)

## 2020-04-27 LAB — GLUCOSE, CAPILLARY
Glucose-Capillary: 58 mg/dL — ABNORMAL LOW (ref 70–99)
Glucose-Capillary: 65 mg/dL — ABNORMAL LOW (ref 70–99)
Glucose-Capillary: 98 mg/dL (ref 70–99)
Glucose-Capillary: 81 mg/dL (ref 70–99)
Glucose-Capillary: 67 mg/dL — ABNORMAL LOW (ref 70–99)
Glucose-Capillary: 89 mg/dL (ref 70–99)
Glucose-Capillary: 82 mg/dL (ref 70–99)

## 2020-04-27 LAB — COMPREHENSIVE METABOLIC PANEL
ALT: 11 U/L (ref 0–44)
AST: 16 U/L (ref 15–41)
Albumin: 2.4 g/dL — ABNORMAL LOW (ref 3.5–5.0)
Alkaline Phosphatase: 132 U/L — ABNORMAL HIGH (ref 38–126)
Anion gap: 14 (ref 5–15)
BUN: <5 mg/dL — ABNORMAL LOW (ref 6–20)
CO2: 13 mmol/L — ABNORMAL LOW (ref 22–32)
Calcium: 9.1 mg/dL (ref 8.9–10.3)
Chloride: 108 mmol/L (ref 98–111)
Creatinine, Ser: 0.55 mg/dL (ref 0.44–1.00)
GFR, Estimated: >60 mL/min (ref >60)
Glucose, Bld: 71 mg/dL (ref 70–99)
Potassium: 3.8 mmol/L (ref 3.5–5.1)
Sodium: 135 mmol/L (ref 135–145)
Total Bilirubin: 1.1 mg/dL (ref 0.3–1.2)
Total Protein: 5.9 g/dL — ABNORMAL LOW (ref 6.5–8.1)

## 2020-04-27 MED ORDER — FENTANYL CITRATE (PF) 100 MCG/2ML IJ SOLN: 50.0000 ug | INTRAMUSCULAR | Status: DC | PRN

## 2020-04-27 MED ORDER — LIDOCAINE HCL (PF) 1 % IJ SOLN: 30.0000 mL | INTRAMUSCULAR | Status: DC | PRN

## 2020-04-27 MED ORDER — SODIUM CHLORIDE (PF) 0.9 % IJ SOLN
INTRAMUSCULAR | Status: DC | PRN
  Administered 2020-04-27: 12 mL/h via EPIDURAL

## 2020-04-27 MED ORDER — LACTATED RINGERS IV SOLN: INTRAVENOUS | Status: DC

## 2020-04-27 MED ORDER — ACETAMINOPHEN 325 MG PO TABS: 650.0000 mg | ORAL_TABLET | ORAL | Status: DC | PRN

## 2020-04-27 MED ORDER — PHENYLEPHRINE 40 MCG/ML (10ML) SYRINGE FOR IV PUSH (FOR BLOOD PRESSURE SUPPORT): 80.0000 ug | PREFILLED_SYRINGE | INTRAVENOUS | Status: DC | PRN

## 2020-04-27 MED ORDER — OXYTOCIN-SODIUM CHLORIDE 30-0.9 UT/500ML-% IV SOLN
1.0000 m[IU]/min | INTRAVENOUS | Status: DC
  Administered 2020-04-27: 17:00:00 2 m[IU]/min via INTRAVENOUS
  Filled 2020-04-27: qty 500

## 2020-04-27 MED ORDER — FENTANYL-BUPIVACAINE-NACL 0.5-0.125-0.9 MG/250ML-% EP SOLN: 12.0000 mL/h | EPIDURAL | Status: DC | PRN

## 2020-04-27 MED ORDER — EPHEDRINE 5 MG/ML INJ: 10.0000 mg | INTRAVENOUS | Status: DC | PRN

## 2020-04-27 MED ORDER — LACTATED RINGERS IV SOLN: 500.0000 mL | Freq: Once | INTRAVENOUS | Status: DC

## 2020-04-27 MED ORDER — LIDOCAINE HCL (PF) 1 % IJ SOLN
INTRAMUSCULAR | Status: DC | PRN
  Administered 2020-04-27: 5 mL via EPIDURAL
  Administered 2020-04-27: 2 mL via EPIDURAL
  Administered 2020-04-27: 3 mL via EPIDURAL

## 2020-04-27 MED ORDER — DIPHENHYDRAMINE HCL 50 MG/ML IJ SOLN
12.5000 mg | INTRAMUSCULAR | Status: DC | PRN
Start: 2020-04-27 — End: 2020-04-28

## 2020-04-27 MED ORDER — MISOPROSTOL 25 MCG QUARTER TABLET
25.0000 ug | ORAL_TABLET | ORAL | Status: DC | PRN
  Administered 2020-04-27: 13:00:00 25 ug via VAGINAL
  Filled 2020-04-27: qty 1

## 2020-04-27 MED ORDER — SOD CITRATE-CITRIC ACID 500-334 MG/5ML PO SOLN: 30.0000 mL | ORAL | Status: DC | PRN

## 2020-04-27 MED ORDER — LACTATED RINGERS IV SOLN: 500.0000 mL | INTRAVENOUS | Status: DC | PRN

## 2020-04-27 MED ORDER — OXYCODONE-ACETAMINOPHEN 5-325 MG PO TABS: 1.0000 | ORAL_TABLET | ORAL | Status: DC | PRN

## 2020-04-27 MED ORDER — ONDANSETRON HCL 4 MG/2ML IJ SOLN: 4.0000 mg | Freq: Four times a day (QID) | INTRAMUSCULAR | Status: DC | PRN

## 2020-04-27 MED ORDER — TERBUTALINE SULFATE 1 MG/ML IJ SOLN: 0.2500 mg | Freq: Once | INTRAMUSCULAR | Status: DC | PRN

## 2020-04-27 MED ORDER — OXYTOCIN BOLUS FROM INFUSION
333.0000 mL | Freq: Once | INTRAVENOUS | Status: AC
  Administered 2020-04-28: 11:00:00 333 mL via INTRAVENOUS

## 2020-04-27 MED ORDER — OXYCODONE-ACETAMINOPHEN 5-325 MG PO TABS: 2.0000 | ORAL_TABLET | ORAL | Status: DC | PRN

## 2020-04-27 MED ORDER — FENTANYL-BUPIVACAINE-NACL 0.5-0.125-0.9 MG/250ML-% EP SOLN
12.0000 mL/h | EPIDURAL | Status: DC | PRN
Start: 2020-04-27 — End: 2020-04-28
  Filled 2020-04-27 (×2): qty 250

## 2020-04-27 MED ORDER — OXYTOCIN-SODIUM CHLORIDE 30-0.9 UT/500ML-% IV SOLN
2.5000 [IU]/h | INTRAVENOUS | Status: DC
  Filled 2020-04-27: qty 500

## 2020-04-27 NOTE — Anesthesia Preprocedure Evaluation (Signed)
Anesthesia Evaluation  Patient identified by MRN, date of birth, ID band Patient awake    Reviewed: Allergy & Precautions, Patient's Chart, lab work & pertinent test results  Airway Mallampati: II  TM Distance: >3 FB     Dental   Pulmonary    breath sounds clear to auscultation       Cardiovascular  Rhythm:Regular Rate:Normal     Neuro/Psych    GI/Hepatic   Endo/Other  diabetes, GestationalMorbid obesity  Renal/GU      Musculoskeletal   Abdominal   Peds  Hematology   Anesthesia Other Findings   Reproductive/Obstetrics (+) Pregnancy                             Anesthesia Physical Anesthesia Plan  ASA: III  Anesthesia Plan: Epidural   Post-op Pain Management:    Induction:   PONV Risk Score and Plan: Treatment may vary due to age or medical condition  Airway Management Planned: Natural Airway  Additional Equipment:   Intra-op Plan:   Post-operative Plan:   Informed Consent: I have reviewed the patients History and Physical, chart, labs and discussed the procedure including the risks, benefits and alternatives for the proposed anesthesia with the patient or authorized representative who has indicated his/her understanding and acceptance.       Plan Discussed with: CRNA  Anesthesia Plan Comments:         Anesthesia Quick Evaluation

## 2020-04-27 NOTE — Progress Notes (Signed)
Hypoglycemic Event  CBG: 58  Treatment: 4 oz orange juice  Symptoms: N/A  Patient had no symptoms  Follow-up CBG: Time: 0610 CBG Result: 65  Treatment: 4 oz orange juice Follow-up CBG:  Time: 0639   CBG: 98  Possible Reasons for Event: Lack of nutrition  Comments/MD notified: Bernerd Pho, CNM made aware.    Pamela Riggs J Daryan Buell

## 2020-04-27 NOTE — Progress Notes (Addendum)
Labor Progress Note  Pamela Riggs is a 34 y.o. female G3P1011 [redacted]w[redacted]d presenting for N/V since 04/22/20, reports emesis after every attempt at eating/drinking. Unable to tolerate solids/liquids. C/O abd pain after multiple episodes of vomiting. Reports active FM and ctx when standing, denies LOF and vaginal bleeding. 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   Subjective: Pt stable in bed, just returned from for EFW, Reviewed POC, risk vs benefits of PCS for LGA 99% with risk of shoulders with induction. Pt verbalized desired to proceed with IOL. Pt denies HA, RUQ pain or vision changes.  I discussed with patient risks, benefits and alternatives of labor induction including higher risk of cesarean delivery compared to spontaneous labor.  We discussed risks of induction agents including effects on fetal heart beat, contraction pattern and need for close monitoring.  Patient expressed understanding of all this and desired to proceed with the induction. Risks and benefits of induction were reviewed, including failure of method, prolonged labor, need for further intervention, risk of cesarean.  Patient and family verbalized understanding and denies any further questions at this time. Pt and family wish to proceed with induction process. Discussed induction options of Cytotec, foley bulb, AROM, and pitocin were reviewed as well as risks and benefits with use of each discussed.  Patient Active Problem List   Diagnosis Date Noted  . Proteinuria affecting pregnancy, antepartum, third trimester 04/25/2020  . Gestational diabetes mellitus in puerperium, controlled by oral hypoglycemic drugs 04/25/2020  . 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 N&V (nausea and vomiting) 04/25/2020   Objective: BP 122/67 (BP Location: Right Arm)   Pulse 92   Temp 97.8 F (36.6 C) (Oral)   Resp 18   Ht 5\' 2"  (1.575 m)   Wt 108.2 kg   LMP 08/02/2019 (Approximate)   SpO2 99%   BMI 43.64 kg/m  I/O last  3 completed shifts: In: 1084.4 [I.V.:1084.4] Out: 2850 [Urine:2850] No intake/output data recorded. NST: FHR baseline 130 bpm, Variability: moderate, Accelerations:present, Decelerations:  Absent= Cat 1/Reactive CTX:  Occ Uterus gravid, soft non tender, moderate to palpate with contractions.  SVE:  Dilation: 1 Effacement (%): Thick Station: Ballotable Exam by:: [facility], CNM Pitocin at (N/A) mUn/min  Assessment:  Pamela Riggs is a 34 y.o. female G3P1011 [redacted]w[redacted]d presenting for N/V since 04/22/20, reports emesis after every attempt at eating/drinking. Unable to tolerate solids/liquids. C/O abd pain after multiple episodes of vomiting. Reports active FM and ctx when standing, denies LOF and vaginal bleeding. 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 Pt dx with Pre without severe features on 12/21, no meds, pt to be transferred to LD for IOL.  Patient Active Problem List   Diagnosis Date Noted  . Proteinuria affecting pregnancy, antepartum, third trimester 04/25/2020  . Gestational diabetes mellitus in puerperium, controlled by oral hypoglycemic drugs 04/25/2020  . Turner syndrome mosaicism, 28, X/46, XX or XY 04/25/2020  . N&V (nausea and vomiting) 04/25/2020   NICHD: Category 1  Membranes:  Intact, no s/s of infection  Induction:    Cytotec xTo be placed now   Foley Bulb: Anticipate in 4 hours  Pitocin - Once cervix ripened.   Pain management:               IV pain management: x PRN             Epidural placement: PRN in active labor   GBS Negative.   A1GDM: stable sugars, EFW 54 just now  was 9.3lbs 99%, AFI 16.7, vertex, posterior placenta,  BPP 8/8. Pt counseled on risk that LGA has with shoulder dystocia, reviewed R/B/A of  IOL versus PCS, pelvis proven to 7.13lbs, pelvis adequate.  CBG (last 3)  Recent Labs    04/27/20 0610 04/27/20 0639 04/27/20 1045  GLUCAP 65* 98 81   PreE without severe features: Stable, asymptomatic, BP now 122/67, PCR 12/20 was 0.48, 24H  Urine 462  resulted on 12/21, 12/20 CMP CBC WNL.   Plan: Continue labor plan PreE: Monitor BP, labetalol protocol for severe range >160/110, no mag indicated at this time no neuro s/sx, if present will start, no meds indicated, transferr to LD for IOL, reapeat CBC, CMP now.  GDMA2: Q4H CBG then Q2H in active labor.  Plan for Cytotec for cervical ripening.  Continuous monitoring Rest Ambulate Frequent position changes to facilitate fetal rotation and descent. Will reassess with cervical exam at 4 hours or earlier if necessary Anticipate labor progression and vaginal delivery.   Md Dillard aware of plan and verbalized agreement.   Dale Newhall, NP-C, CNM, MSN 04/27/2020. 11:34 AM

## 2020-04-27 NOTE — Progress Notes (Signed)
Labor Progress Note  Pamela Riggs is a 34 y.o. female G3P1011 [redacted]w[redacted]d presenting for N/V since 04/22/20, reports emesis after every attempt at eating/drinking, which has now resolved. ABle to tolerate solids/liquids. Unable to tolerate solids/liquids. C/O abd pain after multiple episodes of vomiting. Reports active FM and ctx when standing, denies LOF and vaginal bleeding. 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   Subjective: Pt stable in bed, feel cxt mildly and tolerated well. Discussed R/B/A of foley bulb and pitocin, pt verbalized consent. 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 NST: FHR baseline 150 bpm, Variability: moderate, Accelerations:present, Decelerations:  Absent= Cat 1/Reactive CTX:  Q3-68mins, lasting 20-50 seconds Uterus gravid, soft non tender, mild to palpate with contractions.  SVE:  Dilation: 2.5 Effacement (%): Thick Station: -3 Exam by:: J. Plumer Mittelstaedt Pitocin at (to start now) mUn/min [place] cath placed with ease, [phone] of NS in uterine balloon, and Luceni NS in vaginal balloon, tolerated well.   Assessment:  Pamela Riggs is a 34 y.o. female G3P1011 [redacted]w[redacted]d presenting for N/V since 04/22/20, reports emesis after every attempt at eating/drinking, which has now resolved. ABle to tolerate solids/liquids. Reports active FM and ctx when standing, denies LOF and vaginal  bleeding. 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 Pt dx with Pre without severe features on 12/21, no meds, pt currently progressing in early labor with one Cytotec, foley placed now and pitocin to be started.   Patient Active Problem List   Diagnosis Date Noted   Encounter for induction of labor 04/27/2020   Proteinuria affecting pregnancy, antepartum, third trimester 04/25/2020   Gestational diabetes mellitus in puerperium, controlled by oral hypoglycemic drugs 04/25/2020   Turner syndrome mosaicism, 35, X/46, XX or XY 04/25/2020   N&V (nausea and vomiting) 04/25/2020   NICHD: Category 1  Membranes:  Intact, no s/s of infection  Induction:    Cytotec x1237 on 12/22  Foley Bulb: Placed at 1614 on 12/22  Pitocin - to start at 2 now to max of 10 until cervix ripe   Pain management:               IV pain management: x PRN             Epidural placement: PRN in active labor   GBS Negative.   A1GDM: stable sugars, EFW 01-06-1976 just now was 9.3lbs 99%, AFI 16.7, vertex, posterior placenta,  BPP 8/8. Pt counseled on risk that LGA has with shoulder dystocia, reviewed R/B/A of  IOL versus PCS, pelvis proven to 7.13lbs, pelvis adequate. Encourage snacks and juice. CBG (last 3)  Recent Labs    04/27/20 0639 04/27/20 1045 04/27/20 1431  GLUCAP 98 81 67*   PreE without severe features: Stable, asymptomatic, BP now 138/80, PCR 12/20 was 0.48, 24H  Urine 462 resulted  on 12/21, 12/20 & 12/22 CMP CBC WNL.   Plan: Continue labor plan PreE: Monitor BP, labetalol protocol for severe range >160/110, no mag indicated at this time no neuro s/sx, if present will start, no meds indicated, transferr to LD for IOL, reapeat CBC, CMP in the morning.  GDMA2: Q4H CBG then Q2H in active labor.  Start pitocin at 2x2 to max of 10 until foley bulb out Continuous monitoring Rest Ambulate Frequent position changes to facilitate fetal rotation and descent. Will reassess with cervical exam at 4 hours or earlier  if necessary Anticipate labor progression and vaginal delivery.  Birth Time predicted for 10:57pm  [facility], NP-C, CNM, MSN 04/27/2020. 5:00 PM

## 2020-04-27 NOTE — Progress Notes (Signed)
Labor Progress Note  Pamela Riggs is a 34 y.o. female G3P1011 [redacted]w[redacted]d presenting for N/V since 04/22/20, reports emesis after every attempt at eating/drinking, which has now resolved. ABle to tolerate solids/liquids. Unable to tolerate solids/liquids. C/O abd pain after multiple episodes of vomiting. Reports active FM and ctx when standing, denies LOF and vaginal bleeding. 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   Subjective: Pt stable is breathing through cxt, pt tolerance for pain is decreasing. Discussed pain meds options, pt aware. Foley bulb out. Denies HA, RUQ pain or vision changes.  Patient Active Problem List   Diagnosis Date Noted   Encounter for induction of labor 04/27/2020   Proteinuria affecting pregnancy, antepartum, third trimester 04/25/2020   Gestational diabetes mellitus in puerperium, controlled by oral hypoglycemic drugs 04/25/2020   Turner syndrome mosaicism, 88, X/46, XX or XY 04/25/2020   N&V (nausea and vomiting) 04/25/2020   Objective: BP 133/81    Pulse 100    Temp 98.1 F (36.7 C) (Oral)    Resp 18    Ht 5\' 2"  (1.575 m)    Wt 108.2 kg    LMP 08/02/2019 (Approximate)    SpO2 100%    BMI 43.64 kg/m  I/O last 3 completed shifts: In: -  Out: 1800 [Urine:1800] No intake/output data recorded. NST: FHR baseline 150 bpm, Variability: moderate, Accelerations:present, Decelerations:  Absent= Cat 1/Reactive CTX:  Q3-11mins, lasting 20-50 seconds Uterus gravid, soft non tender, mild to palpate with contractions.  SVE:  Dilation: 4 Effacement (%): Thick Station: -3 Exam by:: 11m, CNM Pitocin at (6) mUn/min [place] cath out.   Assessment:  Pamela Riggs is a 34 y.o. female G3P1011 [redacted]w[redacted]d presenting for N/V since 04/22/20, reports emesis after every attempt at eating/drinking, which has now resolved. ABle to tolerate solids/liquids. Reports active FM and ctx when standing, denies LOF and vaginal bleeding. 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 Pt dx with Pre without  severe features on 12/21, no meds, pt currently progressing in early labor with one Cytotec, foley placed out and pitocin infusing, fetus station to high for AROM.  Patient Active Problem List   Diagnosis Date Noted   Encounter for induction of labor 04/27/2020   Proteinuria affecting pregnancy, antepartum, third trimester 04/25/2020   Gestational diabetes mellitus in puerperium, controlled by oral hypoglycemic drugs 04/25/2020   Turner syndrome mosaicism, 38, X/46, XX or XY 04/25/2020   N&V (nausea and vomiting) 04/25/2020   NICHD: Category 1  Membranes:  Intact, no s/s of infection  Induction:    Cytotec x1237 on 12/22  Foley Bulb: Placed at 1614 on 12/22, out at 2000 12/22  Pitocin - 6  Pain management:               IV pain management: x PRN             Epidural placement: PRN in active labor   GBS Negative.   A1GDM: stable sugars, EFW 1/23 just now was 9.3lbs 99%, AFI 16.7, vertex, posterior placenta,  BPP 8/8. Pt counseled on risk that LGA has with shoulder dystocia, reviewed R/B/A of  IOL versus PCS, pelvis proven to 7.13lbs, pelvis adequate. Encourage snacks and juice. CBG (last 3)  Recent Labs    04/27/20 1045 04/27/20 1431 04/27/20 1826  GLUCAP 81 67* 89   PreE without severe features: Stable, asymptomatic, BP now 133/81, PCR 12/20 was 0.48, 24H  Urine 462 resulted on 12/21, 12/20 & 12/22 CMP CBC WNL.   Plan: Continue labor  plan PreE: Monitor BP, labetalol protocol for severe range >160/110, no mag indicated at this time no neuro s/sx, if present will start, no meds indicated, transferr to LD for IOL, reapeat CBC, CMP in the morning.  GDMA2: Q4H CBG then Q2H in active labor.  Continue pitocin at 2x2  Continuous monitoring Rest Ambulate Frequent position changes to facilitate fetal rotation and descent. Will reassess with cervical exam at 4 hours or earlier if necessary Anticipate labor progression and vaginal delivery.  Birth Time predicted for 5am  Dale Kenvir, NP-C, CNM, MSN 04/27/2020. 8:35 PM

## 2020-04-27 NOTE — Anesthesia Procedure Notes (Signed)
Epidural Patient location during procedure: OB Start time: 04/27/2020 9:00 PM End time: 04/27/2020 9:06 PM  Staffing Anesthesiologist: Marcene Duos, MD Performed: anesthesiologist   Preanesthetic Checklist Completed: patient identified, IV checked, site marked, risks and benefits discussed, surgical consent, monitors and equipment checked, pre-op evaluation and timeout performed  Epidural Patient position: sitting Prep: DuraPrep and site prepped and draped Patient monitoring: continuous pulse ox and blood pressure Approach: midline Location: L4-L5 Injection technique: LOR air  Needle:  Needle type: Tuohy  Needle gauge: 17 G Needle length: 9 cm and 9 Needle insertion depth: 6 cm Catheter type: closed end flexible Catheter size: 19 Gauge Catheter at skin depth: 11 cm Test dose: negative  Assessment Events: blood not aspirated, injection not painful, no injection resistance, no paresthesia and negative IV test

## 2020-04-28 LAB — GLUCOSE, CAPILLARY
Glucose-Capillary: 90 mg/dL (ref 70–99)
Glucose-Capillary: 83 mg/dL (ref 70–99)
Glucose-Capillary: 94 mg/dL (ref 70–99)

## 2020-04-28 LAB — RPR: RPR Ser Ql: NONREACTIVE

## 2020-04-28 MED ORDER — PRENATAL MULTIVITAMIN CH
1.0000 | ORAL_TABLET | Freq: Every day | ORAL | Status: DC
  Administered 2020-04-29: 11:00:00 1 via ORAL
  Filled 2020-04-28: qty 1

## 2020-04-28 MED ORDER — BENZOCAINE-MENTHOL 20-0.5 % EX AERO: 1.0000 "application " | INHALATION_SPRAY | CUTANEOUS | Status: DC | PRN

## 2020-04-28 MED ORDER — SIMETHICONE 80 MG PO CHEW: 80.0000 mg | CHEWABLE_TABLET | ORAL | Status: DC | PRN

## 2020-04-28 MED ORDER — ZOLPIDEM TARTRATE 5 MG PO TABS: 5.0000 mg | ORAL_TABLET | Freq: Every evening | ORAL | Status: DC | PRN

## 2020-04-28 MED ORDER — DIPHENHYDRAMINE HCL 25 MG PO CAPS: 25.0000 mg | ORAL_CAPSULE | Freq: Four times a day (QID) | ORAL | Status: DC | PRN

## 2020-04-28 MED ORDER — WITCH HAZEL-GLYCERIN EX PADS
1.0000 "application " | MEDICATED_PAD | CUTANEOUS | Status: DC | PRN
  Administered 2020-04-28: 1 via TOPICAL

## 2020-04-28 MED ORDER — ACETAMINOPHEN 325 MG PO TABS: 650.0000 mg | ORAL_TABLET | ORAL | Status: DC | PRN

## 2020-04-28 MED ORDER — ONDANSETRON HCL 4 MG/2ML IJ SOLN: 4.0000 mg | INTRAMUSCULAR | Status: DC | PRN

## 2020-04-28 MED ORDER — IBUPROFEN 600 MG PO TABS
600.0000 mg | ORAL_TABLET | Freq: Four times a day (QID) | ORAL | Status: DC
  Administered 2020-04-28 – 2020-04-29 (×4): 600 mg via ORAL
  Filled 2020-04-28 (×4): qty 1

## 2020-04-28 MED ORDER — COCONUT OIL OIL: 1.0000 "application " | TOPICAL_OIL | Status: DC | PRN

## 2020-04-28 MED ORDER — HYDROXYZINE HCL 25 MG PO TABS
50.0000 mg | ORAL_TABLET | Freq: Three times a day (TID) | ORAL | Status: DC | PRN
  Administered 2020-04-28: 05:00:00 50 mg via ORAL
  Filled 2020-04-28: qty 1

## 2020-04-28 MED ORDER — ONDANSETRON HCL 4 MG PO TABS: 4.0000 mg | ORAL_TABLET | ORAL | Status: DC | PRN

## 2020-04-28 MED ORDER — DIBUCAINE (PERIANAL) 1 % EX OINT: 1.0000 "application " | TOPICAL_OINTMENT | CUTANEOUS | Status: DC | PRN

## 2020-04-28 MED ORDER — SENNOSIDES-DOCUSATE SODIUM 8.6-50 MG PO TABS
2.0000 | ORAL_TABLET | Freq: Every day | ORAL | Status: DC
  Administered 2020-04-29: 09:00:00 2 via ORAL
  Filled 2020-04-28: qty 2

## 2020-04-28 MED ORDER — TETANUS-DIPHTH-ACELL PERTUSSIS 5-2.5-18.5 LF-MCG/0.5 IM SUSY
0.5000 mL | PREFILLED_SYRINGE | Freq: Once | INTRAMUSCULAR | Status: AC
  Administered 2020-04-29: 12:00:00 0.5 mL via INTRAMUSCULAR
  Filled 2020-04-28: qty 0.5

## 2020-04-28 NOTE — Progress Notes (Signed)
Subjective: Pamela Riggs is having some discomfort on her right side.  No change with position change so anesthesia called.  Husband in room  Objective: BP 114/63 (BP Location: Right Arm)   Pulse (!) 110   Temp 97.7 F (36.5 C) (Oral)   Resp 18   Ht 5\' 2"  (1.575 m)   Wt 108.2 kg   LMP 08/02/2019 (Approximate)   SpO2 100%   BMI 43.64 kg/m  I/O last 3 completed shifts: In: 1864.9 [I.V.:1864.9] Out: 800 [Urine:800] No intake/output data recorded. 08/04/2019 EFW 9.3 lbs FHT: Category 1 FHT 150 variability present early decels noted with contractions UC:   regular, every 2-3 minutes SVE:   Dilation: 8 Effacement (%): 90 Station: 0 Exam by:: Devaney RN Pitocin at 20 mu MVUs 180 AROM at 0423 Assessment:  34 yo G3P1011 at 37.2 IUP with GDMA1 on gluyburide 5mg  BID Sugars wnl Pre eclampsia with out severe features  24 urine protein 462 BP normal normal labs   Plan: Monitor progress Titrate pitocin for adequate contractions Monitor CBG every 2 Will treat if severe range BPs Anticipate SVD [id number] CNM, MSN 04/28/2020, 8:15 AM

## 2020-04-28 NOTE — Progress Notes (Addendum)
Labor Progress Note  Pamela Riggs is a 34 y.o. female G3P1011 [redacted]w[redacted]d presenting for N/V since 04/22/20, reports emesis after every attempt at eating/drinking, which has now resolved. ABle to tolerate solids/liquids. Unable to tolerate solids/liquids. C/O abd pain after multiple episodes of vomiting. Reports active FM and ctx when standing, denies LOF and vaginal bleeding. 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   Subjective: Pt stable comfortable with epidural, resting, but anxious about everything only able to sleep for 30 mins on and off, discussed atarax for anxiety, pt consented, also discussed RT/B/A of AROM and IUPC pt verbalized consent. Denies HA, RUQ pain or vision changes.  Patient Active Problem List   Diagnosis Date Noted  . Encounter for induction of labor 04/27/2020  . Proteinuria affecting pregnancy, antepartum, third trimester 04/25/2020  . Gestational diabetes mellitus in puerperium, controlled by oral hypoglycemic drugs 04/25/2020  . Turner syndrome mosaicism, 36, X/46, XX or XY 04/25/2020  . N&V (nausea and vomiting) 04/25/2020   Objective: BP (!) 139/93   Pulse 93   Temp 97.8 F (36.6 C) (Oral)   Resp 18   Ht 5\' 2"  (1.575 m)   Wt 108.2 kg   LMP 08/02/2019 (Approximate)   SpO2 100%   BMI 43.64 kg/m  I/O last 3 completed shifts: In: -  Out: 1800 [Urine:1800] Total I/O In: 1717 [I.V.:1717] Out: 550 [Urine:550] NST: FHR baseline 145 bpm, Variability: moderate, Accelerations:present, Decelerations:  Absent= Cat 1/Reactive CTX:  Q2-88mins, lasting 20-50 seconds Uterus gravid, soft non tender, mild to palpate with contractions.  SVE:  Dilation: 5.5 Effacement (%): 60 Station: -2 Exam by:: 11m, CNM Pitocin at (18) mUn/min Cervix feels like foley bulb cervix, very anterior  AROM, clear, large quantity, tolerated well, placed IUPC with MVU   160-200s  Assessment:  Pamela Riggs is a 34 y.o. female G3P1011 [redacted]w[redacted]d presenting for N/V since 04/22/20, reports  emesis after every attempt at eating/drinking, which has now resolved. Able to tolerate solids/liquids. Reports active FM and ctx when standing, denies LOF and vaginal bleeding. 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 Pt dx with Pre without severe features on 12/21, no meds, pt currently progressing in latent labor with one Cytotec, foley out and pitocin infusing, AROM with IUPC.  Patient Active Problem List   Diagnosis Date Noted  . Encounter for induction of labor 04/27/2020  . Proteinuria affecting pregnancy, antepartum, third trimester 04/25/2020  . Gestational diabetes mellitus in puerperium, controlled by oral hypoglycemic drugs 04/25/2020  . Turner syndrome mosaicism, 33, X/46, XX or XY 04/25/2020  . N&V (nausea and vomiting) 04/25/2020   NICHD: Category 1  Membranes:  AROM 0423 on 12/23, large amount, clear, no s/s of infection  IUPC in place with MVU: 160-200s  Induction:    Cytotec x1237 on 12/22  Foley Bulb: Placed at 1614 on 12/22, out at 2000 12/22  Pitocin - 18  Pain management:               IV pain management: x PRN             Epidural placement: Placed 12/22 @ 2117  GBS Negative.   A1GDM: stable sugars, EFW 1/23 just now was 9.3lbs 99%, AFI 16.7, vertex, posterior placenta,  BPP 8/8. Pt counseled on risk that LGA has with shoulder dystocia, reviewed R/B/A of  IOL versus PCS, pelvis proven to 7.13lbs, pelvis adequate. Encourage snacks and juice. CBG (last 3)  Recent Labs    04/27/20 1826 04/27/20 2235 04/28/20 04/29/20  GLUCAP 89 82 90   PreE without severe features: Stable, asymptomatic, BP now 139/93, PCR 12/20 was 0.48, 24H  Urine 462 resulted on 12/21, 12/20 & 12/22 CMP CBC WNL.   Plan: Continue labor plan Anxiety: Atarax 50mg  TID PRN PreE: Monitor BP, labetalol protocol for severe range >160/110, no mag indicated at this time no neuro s/sx, if present will start, no meds indicated, transferr to LD for IOL, reapeat CBC, CMP in the morning.  GDMA2: Q4H CBG then  Q2H in active labor.  Continue pitocin at 2x2 titrate to MVUs Continuous monitoring Rest Ambulate Frequent position changes to facilitate fetal rotation and descent. Will reassess with cervical exam at 4 hours or earlier if necessary Anticipate labor progression and vaginal delivery.   , NP-C, CNM, MSN 04/28/2020. 5:11 AM

## 2020-04-28 NOTE — Lactation Note (Addendum)
This note was copied from a baby's chart. Lactation Consultation Note  Patient Name: Boy Keeghan Mcintire [id number] Date: 04/28/2020 Reason for consult: Mother's request;1st time breastfeeding;Early term 37-38.6wks;Maternal endocrine disorder (Preclampsia without severe findings) Type of Endocrine Disorder?: Diabetes (Glyburide 5 mg bid)  Infant is 37 weeks 0 hours old. LC attempted to latch infant but he was very spitty and drowsy. Mom taught hand expression and infant placed in prone position where he can lick drops of colostrum off of the breasts.   LC reviewed with parents importance of feeding infant based on cues 8-12x in 24 hour period. No more than 4 hours without an attempt placing infant STS.   Mom able to express colostrum from her right breast. Mom noted breast changes during pregnancy and leaking colostrum. Mom taught to hand express colostrum for a spoon feeding if infant is not able to latch.   Lactation brochure of inpatient and outpatient services provided to parents.

## 2020-04-28 NOTE — Lactation Note (Addendum)
This note was copied from a baby's chart. Lactation Consultation Note  Patient Name: Boy Mercadez Heitman [id number] Date: 04/28/2020 Reason for consult: Follow-up assessment;Mother's request;Early term 37-38.6wks;Maternal endocrine disorder Type of Endocrine Disorder? (retired): Diabetes (Glyburide 5 mg bid) Age:33 hours  Infant is 37 weeks 4 hours old. Mom has infant latched on arrival, shallow at the nipple. LC assisted Mom to get a deeper latch, total feeding time 10 minutes. As per Mom, RN spoon fed infant 4 ml before latching.   Mom's nipples are soft and compressible, nipples are large with no signs of nipple trauma.   LC reviewed with Mom feeding based on cues, to d/c use of a pacifier for the first four weeks to establish a good latch, and how to track feedings, urine and stool on the I and O sheet.   Mom has WIC and lives in [place]. She does not have a manual pump at home. Mom states with her first child she had low milk supply for the first 2-3 months but later was able to nurse for 2 years.  LC had Mom fill out a [facility] referral and faxed it to [facility].  Mom provided with a manual pump.   Plan 1. To feed based on cues 8-12x in 24 hour period, no more than 4 hours without at attempt.            2. Mom to offer both breasts, STS and look for signs of milk transfer.            3. Mom to do breast massage, hand expression and spoon feeding if unable to latch infant at the breast. Spoon and snappies provided. EBM storage reviewed.

## 2020-04-29 LAB — CBC
HCT: 29.2 % — ABNORMAL LOW (ref 36.0–46.0)
Hemoglobin: 10.5 g/dL — ABNORMAL LOW (ref 12.0–15.0)
MCH: 30.8 pg (ref 26.0–34.0)
MCHC: 36 g/dL (ref 30.0–36.0)
MCV: 85.6 fL (ref 80.0–100.0)
Platelets: 192 10*3/uL (ref 150–400)
RBC: 3.41 MIL/uL — ABNORMAL LOW (ref 3.87–5.11)
RDW: 13.4 % (ref 11.5–15.5)
WBC: 9.9 10*3/uL (ref 4.0–10.5)
nRBC: 0 % (ref 0.0–0.2)

## 2020-04-29 MED ORDER — IBUPROFEN 600 MG PO TABS: 600.0000 mg | ORAL_TABLET | Freq: Four times a day (QID) | ORAL | 0 refills | Status: DC

## 2020-04-29 MED ORDER — ACETAMINOPHEN 325 MG PO TABS: 650.0000 mg | ORAL_TABLET | ORAL | Status: DC | PRN

## 2020-04-29 MED ORDER — INFLUENZA VAC SPLIT QUAD 0.5 ML IM SUSY: 0.5000 mL | PREFILLED_SYRINGE | INTRAMUSCULAR | Status: DC

## 2020-04-29 MED ORDER — INFLUENZA VAC SPLIT QUAD 0.5 ML IM SUSY
0.5000 mL | PREFILLED_SYRINGE | INTRAMUSCULAR | Status: AC
  Administered 2020-04-29: 12:00:00 0.5 mL via INTRAMUSCULAR
  Filled 2020-04-29: qty 0.5

## 2020-04-29 NOTE — Discharge Summary (Signed)
NSVD without Lacerations OB Discharge Summary  Patient Name: Pamela Riggs DOB: 01/08/1986 MRN: [id number]  Date of admission: 04/25/2020 Intrauterine pregnancy: [redacted]w[redacted]d   Admitting diagnosis: Proteinuria affecting pregnancy, antepartum, third trimester [O12.13] Encounter for induction of labor [Z34.90] Secondary diagnosis: Gestational Diabetes medication controlled (A2)  Date of discharge: 04/29/2020    Discharge diagnosis: Term Pregnancy Delivered     Prenatal history: G3P1011   EDC : 05/17/2020, by Other Basis  P[facility] care at [facility]  Primary provider : [facility] Prenatal course complicated by GDM on glyburide, Proteinuria during pregancy  Prenatal Labs: ABO, Rh: --/--/A POS (12/20 1757)  Antibody: NEG (12/20 1757) Rubella:   Immune RPR: NON REACTIVE (12/22 1303)  HBsAg:   NR  HIV:   NR GBS: NEGATIVE/-- (12/20 1849)                                    Hospital course:  Induction of Labor With Vaginal Delivery   34 y.o. yo G3P1011 at [redacted]w[redacted]d was admitted to the hospital 04/25/2020 for induction of labor.  Indication for induction: A2 DM.  Patient had an uncomplicated labor course as follows: Membrane Rupture Time/Date: 4:23 AM ,04/28/2020   Delivery Method:Vaginal, Spontaneous  Episiotomy: None  Lacerations:    Details of delivery can be found in separate delivery note.  Patient had a routine postpartum course. Patient is discharged home 04/29/20.  Newborn Data: Birth date:04/28/2020  Birth time:11:02 AM  Gender:Female  Living status:Living  Apgars:9 ,9  Weight:3504 g  Delivering PROVIDER: Bernerd Pho JEAN                                                            Complications: None  Newborn Data: Live born female  Birth Weight: 7 lb 11.6 oz (3504 g) APGAR: 9, 9  Newborn Delivery   Birth date/time: 04/28/2020 11:02:00 Delivery type: Vaginal, Spontaneous      Baby Feeding: Breast Disposition:home with mother  Post partum procedures:N/A  Labs: Lab Results   Component Value Date   WBC 9.9 04/29/2020   HGB 10.5 (L) 04/29/2020   HCT 29.2 (L) 04/29/2020   MCV 85.6 04/29/2020   PLT 192 04/29/2020   CMP Latest Ref Rng & Units 04/27/2020  Glucose 70 - 99 mg/dL 71  BUN 6 - 20 mg/dL <4(U)  Creatinine 9.81 - 1.00 mg/dL 1.91  Sodium 478 - 295 mmol/L 135  Potassium 3.5 - 5.1 mmol/L 3.8  Chloride 98 - 111 mmol/L 108  CO2 22 - 32 mmol/L 13(L)  Calcium 8.9 - 10.3 mg/dL 9.1  Total Protein 6.5 - 8.1 g/dL 5.9(L)  Total Bilirubin 0.3 - 1.2 mg/dL 1.1  Alkaline Phos 38 - 126 U/L 132(H)  AST 15 - 41 U/L 16  ALT 0 - 44 U/L 11    Physical Exam @ time of discharge:  Vitals:   04/28/20 1735 04/28/20 2116 04/29/20 0136 04/29/20 0514  BP: 121/72 117/67 127/90 111/84  Pulse: (!) 102 93 85 86  Resp: 20 18 18 17   Temp: 97.8 F (36.6 C) 98.2 F (36.8 C) 98.1 F (36.7 C) 98.2 F (36.8 C)  TempSrc: Oral Oral Oral Oral  SpO2: 99% 98% 100% 99%  Weight:  Height:       general: alert, cooperative and no distress lochia: inappropriate uterine fundus: firm perineum: intact Extremities: WNL DVT Evaluation: No evidence of DVT seen on physical exam. Negative Homan's sign.  Discharge instructions:  "Baby and Me Booklet" Discharge Medications:  Allergies as of 04/29/2020   No Known Allergies     Medication List    STOP taking these medications   glyBURIDE 5 MG tablet Commonly known as: DIABETA     TAKE these medications   acetaminophen 325 MG tablet Commonly known as: Tylenol Take 2 tablets (650 mg total) by mouth every 4 (four) hours as needed (for pain scale < 4).   ibuprofen 600 MG tablet Commonly known as: ADVIL Take 1 tablet (600 mg total) by mouth every 6 (six) hours.   PRENATAL VITAMINS PO Take by mouth.      Diet: routine diet Activity: Advance as tolerated. Pelvic rest x 6 weeks.  Follow up:6 weeks  Dr Normand Sloop aware of pt status and POC reviewed.   Signed: Carollee Leitz MSN, CNM 04/29/2020, 8:25 AM

## 2020-04-29 NOTE — Anesthesia Postprocedure Evaluation (Signed)
Anesthesia Post Note  Patient: Pamela Riggs  Procedure(s) Performed: AN AD HOC LABOR EPIDURAL     Patient location during evaluation: Mother Baby Anesthesia Type: Epidural Level of consciousness: awake and alert, oriented and patient cooperative Pain management: pain level controlled Vital Signs Assessment: post-procedure vital signs reviewed and stable Respiratory status: spontaneous breathing Cardiovascular status: stable Postop Assessment: no headache, epidural receding, patient able to bend at knees and no signs of nausea or vomiting Anesthetic complications: no Comments: Pt. States she is walking.  No c/o pain.    No complications documented.  Last Vitals:  Vitals:   04/29/20 0136 04/29/20 0514  BP: 127/90 111/84  Pulse: 85 86  Resp: 18 17  Temp: 36.7 C 36.8 C  SpO2: 100% 99%    Last Pain:  Vitals:   04/29/20 0514  TempSrc: Oral  PainSc:    Pain Goal: Patients Stated Pain Goal: 3 (04/26/20 0811)                 Merrilyn Puma

## 2020-04-29 NOTE — Lactation Note (Signed)
This note was copied from a baby's chart. Lactation Consultation Note  Patient Name: Pamela Riggs Date: 04/29/2020 Reason for consult: Follow-up assessment;Early term 37-38.6wks;Infant weight loss Age:33 hours  3 % weight loss , at 19 hours skin Bili - 6.0 - on the border of low intermediate to High. ( made parents aware ).  Per mom baby has not fed since 6 am and had a good feeding.  LC recommended to place the baby STS. LC offered to assist , diaper dry.  LC placed baby STS and baby awake , rooting, Latched easily and fed for 12 mins , with swallows. Noted to be sluggish at times during the feeding needing stimulation.  Per mom had been feeling pinching when she 1st latches. LC noted areola edema at the base of the nipple and reviewed massage , hand express ( drops ) and reverse pressure. Per mom felt better.  Mom already has the hand pump. LC added to the [facility] plan shells between feedings while awake until the areola is consistently compressible and the latch is comfortable. Also a #27 F if needed when the milk comes in.  Sore nipple and engorgement prevention and tx reviewed. Mom and dad expressed they would like early D/C and RN aware.      Maternal Data Has patient been taught Hand Expression?: Yes  Feeding Feeding Type: Breast Fed  LATCH Score Latch: Grasps breast easily, tongue down, lips flanged, rhythmical sucking.  Audible Swallowing: A few with stimulation  Type of Nipple: Everted at rest and after stimulation  Comfort (Breast/Nipple): Soft / non-tender  Hold (Positioning): Assistance needed to correctly position infant at breast and maintain latch.  LATCH Score: 8  Interventions Interventions: Breast feeding basics reviewed;Assisted with latch;Skin to skin;Breast massage;Hand express;Reverse pressure;Breast compression;Adjust position;Support pillows;Position options;Shells;Hand pump  Lactation Tools Discussed/Used Tools: Shells;Pump;Flanges Flange  Size: 24;27 Shell Type: Inverted Breast pump type: Manual Pump Education: Milk Storage   Consult Status Consult Status: Complete Date: 04/29/20    Kathrin Greathouse 04/29/2020, 9:06 AM

## 2021-09-25 ENCOUNTER — Other Ambulatory Visit: Payer: Self-pay | Admitting: Obstetrics and Gynecology

## 2021-09-25 ENCOUNTER — Other Ambulatory Visit: Payer: Self-pay

## 2021-09-25 DIAGNOSIS — Z363 Encounter for antenatal screening for malformations: Secondary | ICD-10-CM

## 2021-10-16 ENCOUNTER — Encounter: Payer: Self-pay | Admitting: *Deleted

## 2021-10-20 ENCOUNTER — Other Ambulatory Visit: Payer: Self-pay | Admitting: *Deleted

## 2021-10-20 ENCOUNTER — Encounter: Payer: Self-pay | Admitting: *Deleted

## 2021-10-20 ENCOUNTER — Ambulatory Visit: Payer: Medicaid Other | Admitting: *Deleted

## 2021-10-20 ENCOUNTER — Ambulatory Visit: Payer: Medicaid Other | Attending: Obstetrics and Gynecology

## 2021-10-20 VITALS — BP 115/69 | HR 86

## 2021-10-20 DIAGNOSIS — O280 Abnormal hematological finding on antenatal screening of mother: Secondary | ICD-10-CM | POA: Diagnosis present

## 2021-10-20 DIAGNOSIS — Z363 Encounter for antenatal screening for malformations: Secondary | ICD-10-CM | POA: Diagnosis present

## 2021-10-20 DIAGNOSIS — Q963 Mosaicism, 45, X/46, XX or XY: Secondary | ICD-10-CM

## 2021-10-20 DIAGNOSIS — Z6841 Body Mass Index (BMI) 40.0 and over, adult: Secondary | ICD-10-CM

## 2021-10-20 DIAGNOSIS — Z362 Encounter for other antenatal screening follow-up: Secondary | ICD-10-CM

## 2021-10-20 DIAGNOSIS — O24112 Pre-existing diabetes mellitus, type 2, in pregnancy, second trimester: Secondary | ICD-10-CM

## 2021-11-17 ENCOUNTER — Ambulatory Visit: Payer: Medicaid Other | Attending: Maternal & Fetal Medicine

## 2021-11-17 ENCOUNTER — Ambulatory Visit: Payer: Medicaid Other | Admitting: *Deleted

## 2021-11-17 VITALS — BP 114/61 | HR 90

## 2021-11-17 DIAGNOSIS — O24113 Pre-existing diabetes mellitus, type 2, in pregnancy, third trimester: Secondary | ICD-10-CM | POA: Insufficient documentation

## 2021-11-17 DIAGNOSIS — O99213 Obesity complicating pregnancy, third trimester: Secondary | ICD-10-CM | POA: Diagnosis not present

## 2021-11-17 DIAGNOSIS — Z6841 Body Mass Index (BMI) 40.0 and over, adult: Secondary | ICD-10-CM

## 2021-11-17 DIAGNOSIS — Z3A28 28 weeks gestation of pregnancy: Secondary | ICD-10-CM | POA: Diagnosis not present

## 2021-11-17 DIAGNOSIS — E119 Type 2 diabetes mellitus without complications: Secondary | ICD-10-CM

## 2021-11-17 DIAGNOSIS — O285 Abnormal chromosomal and genetic finding on antenatal screening of mother: Secondary | ICD-10-CM

## 2021-11-17 DIAGNOSIS — O2623 Pregnancy care for patient with recurrent pregnancy loss, third trimester: Secondary | ICD-10-CM | POA: Diagnosis not present

## 2021-11-17 DIAGNOSIS — Z7984 Long term (current) use of oral hypoglycemic drugs: Secondary | ICD-10-CM

## 2021-11-17 DIAGNOSIS — Q963 Mosaicism, 45, X/46, XX or XY: Secondary | ICD-10-CM

## 2021-11-17 DIAGNOSIS — Z362 Encounter for other antenatal screening follow-up: Secondary | ICD-10-CM | POA: Diagnosis not present

## 2021-11-17 DIAGNOSIS — O24112 Pre-existing diabetes mellitus, type 2, in pregnancy, second trimester: Secondary | ICD-10-CM | POA: Diagnosis present

## 2021-11-17 DIAGNOSIS — O403XX0 Polyhydramnios, third trimester, not applicable or unspecified: Secondary | ICD-10-CM

## 2021-11-17 DIAGNOSIS — O09293 Supervision of pregnancy with other poor reproductive or obstetric history, third trimester: Secondary | ICD-10-CM | POA: Insufficient documentation

## 2021-11-17 DIAGNOSIS — E669 Obesity, unspecified: Secondary | ICD-10-CM

## 2021-11-20 ENCOUNTER — Other Ambulatory Visit: Payer: Self-pay | Admitting: *Deleted

## 2021-11-20 DIAGNOSIS — Q963 Mosaicism, 45, X/46, XX or XY: Secondary | ICD-10-CM

## 2021-11-20 DIAGNOSIS — O99213 Obesity complicating pregnancy, third trimester: Secondary | ICD-10-CM

## 2021-11-20 DIAGNOSIS — O24113 Pre-existing diabetes mellitus, type 2, in pregnancy, third trimester: Secondary | ICD-10-CM

## 2021-11-20 DIAGNOSIS — Z6841 Body Mass Index (BMI) 40.0 and over, adult: Secondary | ICD-10-CM

## 2021-12-14 ENCOUNTER — Ambulatory Visit: Payer: Medicaid Other | Attending: Obstetrics and Gynecology

## 2021-12-14 ENCOUNTER — Ambulatory Visit: Payer: Medicaid Other

## 2021-12-22 ENCOUNTER — Other Ambulatory Visit: Payer: Medicaid Other

## 2021-12-22 ENCOUNTER — Ambulatory Visit: Payer: Medicaid Other | Attending: Obstetrics and Gynecology

## 2021-12-29 ENCOUNTER — Ambulatory Visit: Payer: Medicaid Other

## 2022-01-01 ENCOUNTER — Inpatient Hospital Stay (HOSPITAL_COMMUNITY): Payer: Medicaid Other

## 2022-01-01 ENCOUNTER — Inpatient Hospital Stay (HOSPITAL_COMMUNITY)
Admission: AD | Admit: 2022-01-01 | Discharge: 2022-01-01 | Disposition: A | Discharge status: Home / Self Care | Payer: Medicaid Other | Attending: Obstetrics and Gynecology | Admitting: Obstetrics and Gynecology

## 2022-01-01 ENCOUNTER — Encounter (HOSPITAL_COMMUNITY): Payer: Self-pay | Admitting: Obstetrics and Gynecology

## 2022-01-01 ENCOUNTER — Other Ambulatory Visit: Payer: Self-pay

## 2022-01-01 DIAGNOSIS — O26893 Other specified pregnancy related conditions, third trimester: Secondary | ICD-10-CM | POA: Insufficient documentation

## 2022-01-01 DIAGNOSIS — R0602 Shortness of breath: Secondary | ICD-10-CM | POA: Diagnosis not present

## 2022-01-01 DIAGNOSIS — Z3A35 35 weeks gestation of pregnancy: Secondary | ICD-10-CM

## 2022-01-01 DIAGNOSIS — T7840XA Allergy, unspecified, initial encounter: Secondary | ICD-10-CM

## 2022-01-01 LAB — COMPREHENSIVE METABOLIC PANEL WITH GFR
ALT: 15 U/L (ref 0–44)
AST: 20 U/L (ref 15–41)
Albumin: 2.4 g/dL — ABNORMAL LOW (ref 3.5–5.0)
Alkaline Phosphatase: 111 U/L (ref 38–126)
Anion gap: 9 (ref 5–15)
BUN: 7 mg/dL (ref 6–20)
CO2: 18 mmol/L — ABNORMAL LOW (ref 22–32)
Calcium: 9.1 mg/dL (ref 8.9–10.3)
Chloride: 110 mmol/L (ref 98–111)
Creatinine, Ser: 0.52 mg/dL (ref 0.44–1.00)
GFR, Estimated: >60 mL/min
Glucose, Bld: 128 mg/dL — ABNORMAL HIGH (ref 70–99)
Potassium: 4.4 mmol/L (ref 3.5–5.1)
Sodium: 137 mmol/L (ref 135–145)
Total Bilirubin: 0.4 mg/dL (ref 0.3–1.2)
Total Protein: 5.8 g/dL — ABNORMAL LOW (ref 6.5–8.1)

## 2022-01-01 LAB — CBC WITH DIFFERENTIAL/PLATELET
Abs Immature Granulocytes: 0.03 10*3/uL (ref 0.00–0.07)
Basophils Absolute: 0 10*3/uL (ref 0.0–0.1)
Basophils Relative: 0 %
Eosinophils Absolute: 0 10*3/uL (ref 0.0–0.5)
Eosinophils Relative: 1 %
HCT: 32.2 % — ABNORMAL LOW (ref 36.0–46.0)
Hemoglobin: 10.8 g/dL — ABNORMAL LOW (ref 12.0–15.0)
Immature Granulocytes: 0 %
Lymphocytes Relative: 24 %
Lymphs Abs: 2 10*3/uL (ref 0.7–4.0)
MCH: 28.4 pg (ref 26.0–34.0)
MCHC: 33.5 g/dL (ref 30.0–36.0)
MCV: 84.7 fL (ref 80.0–100.0)
Monocytes Absolute: 0.3 10*3/uL (ref 0.1–1.0)
Monocytes Relative: 3 %
Neutro Abs: 5.9 10*3/uL (ref 1.7–7.7)
Neutrophils Relative %: 72 %
Platelets: 223 10*3/uL (ref 150–400)
RBC: 3.8 MIL/uL — ABNORMAL LOW (ref 3.87–5.11)
RDW: 12.7 % (ref 11.5–15.5)
WBC: 8.3 10*3/uL (ref 4.0–10.5)
nRBC: 0 % (ref 0.0–0.2)

## 2022-01-01 LAB — TROPONIN I (HIGH SENSITIVITY): Troponin I (High Sensitivity): 5 ng/L (ref <18)

## 2022-01-01 LAB — D-DIMER, QUANTITATIVE: D-Dimer, Quant: 0.9 ug{FEU}/mL — ABNORMAL HIGH (ref 0.00–0.50)

## 2022-01-01 MED ORDER — LACTATED RINGERS IV BOLUS
1000.0000 mL | Freq: Once | INTRAVENOUS | Status: AC
  Administered 2022-01-01: 1000 mL via INTRAVENOUS

## 2022-01-01 MED ORDER — ALBUTEROL SULFATE HFA 108 (90 BASE) MCG/ACT IN AERS
2.0000 | INHALATION_SPRAY | Freq: Four times a day (QID) | RESPIRATORY_TRACT | 2 refills | Status: AC | PRN
Start: 2022-01-01 — End: ?

## 2022-01-01 MED ORDER — IOHEXOL 350 MG/ML SOLN
65.0000 mL | Freq: Once | INTRAVENOUS | Status: AC | PRN
  Administered 2022-01-01: 65 mL via INTRAVENOUS

## 2022-01-01 NOTE — MAU Note (Addendum)
..  Pamela Riggs is a 36 y.o. at [redacted]w[redacted]d here in MAU reporting: SOB with chest tightening, feet swelling, pelvic pressure, nausea and headaches for about a week and half. She noticed that it was significantly worse this morning, so she called the office and they recommended her to come to MAU. Hasn't felt baby move much today. Denies abnormal discharge or bleeding   Onset of complaint: 12/20/2021 Pain score: 5/10 Vitals:   01/01/22 1152  BP: 136/77  Pulse: (!) 118  Resp: 20  Temp: 98.1 F (36.7 C)     FHT:178 Lab orders placed from triage: UA

## 2022-01-01 NOTE — MAU Provider Note (Signed)
History     CSN: [id number]  Arrival date and time: 01/01/22 1117   Event Date/Time   First Provider Initiated Contact with Patient 01/01/22 1216      Chief Complaint  Patient presents with   Nausea   Shortness of Breath   Pelvic Pain   Leg Swelling   HPI This is a 36 year old G6 P2-0-3-2 at 35 weeks and 1 day who presents with chest pressure, shortness of breath that has been going on for about a week and has been increasing.  She does have increased dyspnea on exertion, as well as increased dyspnea at rest.  No other palliating and provoking factors.  Because her symptoms were worsening, she called her OBs office, who told her to come in to be evaluated.  OB History     Gravida  6   Para  2   Term  2   Preterm      AB  3   Living  2      SAB  3   IAB      Ectopic      Multiple      Live Births  2           Past Medical History:  Diagnosis Date   Medical history non-contributory    Obesity    Vitamin D deficiency     Past Surgical History:  Procedure Laterality Date   DILATION AND CURETTAGE OF UTERUS      Family History  Problem Relation Age of Onset   Hypertension Mother    Asthma Brother    Stroke Paternal Aunt    Diabetes Paternal Grandfather    Cancer Neg Hx     Social History   Tobacco Use   Smoking status: Never   Smokeless tobacco: Never  Vaping Use   Vaping Use: Never used  Substance Use Topics   Alcohol use: Never   Drug use: Never    Allergies: No Known Allergies  Medications Prior to Admission  Medication Sig Dispense Refill Last Dose   Prenatal Vit-Fe Fumarate-FA (PRENATAL VITAMINS PO) Take by mouth.   01/01/2022   VITAMIN D PO Take by mouth.   01/01/2022    Review of Systems Physical Exam   Blood pressure 136/77, pulse (!) 118, temperature 98.1 F (36.7 C), resp. rate 20, height 5\' 2"  (1.575 m), weight 106.1 kg, last menstrual period 04/08/2021, unknown if currently breastfeeding.  Physical Exam Vitals  reviewed.  Constitutional:      Appearance: She is well-developed.  HENT:     Head: Normocephalic and atraumatic.  Cardiovascular:     Rate and Rhythm: Regular rhythm. Tachycardia present. No extrasystoles are present.    Heart sounds: No murmur heard.    No gallop.  Pulmonary:     Effort: Pulmonary effort is normal.     Breath sounds: Normal breath sounds. No decreased breath sounds, wheezing, rhonchi or rales.  Abdominal:     Palpations: Abdomen is soft. There is no hepatomegaly, splenomegaly or mass.     Tenderness: There is no abdominal tenderness. There is no guarding or rebound.  Skin:    Capillary Refill: Capillary refill takes less than 2 seconds.  Neurological:     General: No focal deficit present.     Mental Status: She is alert and oriented to person, place, and time.    Results for orders placed or performed during the hospital encounter of 01/01/22 (from the past 24 hour(s))  CBC with Differential/Platelet  Status: Abnormal   Collection Time: 01/01/22 12:44 PM  Result Value Ref Range   WBC 8.3 4.0 - 10.5 K/uL   RBC 3.80 (L) 3.87 - 5.11 MIL/uL   Hemoglobin 10.8 (L) 12.0 - 15.0 g/dL   HCT 19.5 (L) 09.3 - 26.7 %   MCV 84.7 80.0 - 100.0 fL   MCH 28.4 26.0 - 34.0 pg   MCHC 33.5 30.0 - 36.0 g/dL   RDW 12.4 58.0 - 99.8 %   Platelets 223 150 - 400 K/uL   nRBC 0.0 0.0 - 0.2 %   Neutrophils Relative % 72 %   Neutro Abs 5.9 1.7 - 7.7 K/uL   Lymphocytes Relative 24 %   Lymphs Abs 2.0 0.7 - 4.0 K/uL   Monocytes Relative 3 %   Monocytes Absolute 0.3 0.1 - 1.0 K/uL   Eosinophils Relative 1 %   Eosinophils Absolute 0.0 0.0 - 0.5 K/uL   Basophils Relative 0 %   Basophils Absolute 0.0 0.0 - 0.1 K/uL   Immature Granulocytes 0 %   Abs Immature Granulocytes 0.03 0.00 - 0.07 K/uL  Comprehensive metabolic panel     Status: Abnormal   Collection Time: 01/01/22 12:44 PM  Result Value Ref Range   Sodium 137 135 - 145 mmol/L   Potassium 4.4 3.5 - 5.1 mmol/L   Chloride 110  98 - 111 mmol/L   CO2 18 (L) 22 - 32 mmol/L   Glucose, Bld 128 (H) 70 - 99 mg/dL   BUN 7 6 - 20 mg/dL   Creatinine, Ser 3.38 0.44 - 1.00 mg/dL   Calcium 9.1 8.9 - 25.0 mg/dL   Total Protein 5.8 (L) 6.5 - 8.1 g/dL   Albumin 2.4 (L) 3.5 - 5.0 g/dL   AST 20 15 - 41 U/L   ALT 15 0 - 44 U/L   Alkaline Phosphatase 111 38 - 126 U/L   Total Bilirubin 0.4 0.3 - 1.2 mg/dL   GFR, Estimated >53 >97 mL/min   Anion gap 9 5 - 15  Troponin I (High Sensitivity)     Status: None   Collection Time: 01/01/22 12:44 PM  Result Value Ref Range   Troponin I (High Sensitivity) 5 <18 ng/L  D-dimer, quantitative     Status: Abnormal   Collection Time: 01/01/22 12:44 PM  Result Value Ref Range   D-Dimer, Quant 0.90 (H) 0.00 - 0.50 ug/mL-FEU   CT Angio Chest Pulmonary Embolism (PE) W or WO Contrast  Result Date: 01/01/2022 CLINICAL DATA:  Pulmonary embolism (PE) suspected, positive D-dimer EXAM: CT ANGIOGRAPHY CHEST WITH CONTRAST TECHNIQUE: Multidetector CT imaging of the chest was performed using the standard protocol during bolus administration of intravenous contrast. Multiplanar CT image reconstructions and MIPs were obtained to evaluate the vascular anatomy. RADIATION DOSE REDUCTION: This exam was performed according to the departmental dose-optimization program which includes automated exposure control, adjustment of the mA and/or kV according to patient size and/or use of iterative reconstruction technique. CONTRAST:  51mL OMNIPAQUE IOHEXOL 350 MG/ML SOLN COMPARISON:  None Available. FINDINGS: Cardiovascular: Satisfactory opacification of the pulmonary arteries to the segmental level. No evidence of pulmonary embolism. Main pulmonary artery measures 3.2 cm in greatest diameter and is enlarged. Normal heart size. No pericardial effusion. Mediastinum/Nodes: There are subcentimeter mediastinal lymph nodes seen. There are axillary lymph nodes seen as well and the largest lymph node at the right side measures 9 mm in  the short axis. Lungs/Pleura: Lungs are clear. No pleural effusion or pneumothorax. Upper Abdomen: Small  hiatal hernia. Solid organs have a normal appearance. Musculoskeletal: No chest wall abnormality. No acute or significant osseous findings. Thoracic spondylosis with prominent anterior endplate osteophytes at the mid-lower thoracic vertebrae Review of the MIP images confirms the above findings. IMPRESSION: 1. No PE seen up to the segmental level. Main pulmonary artery measures 3.2 cm which may reflect pulmonary hypertension in the appropriate clinical setting. 2.  Small hiatal hernia. 3.  Thoracic spondylosis. Electronically Signed   By: Frazier Richards M.D.   On: 01/01/2022 17:13   DG CHEST PORT 1 VIEW  Result Date: 01/01/2022 CLINICAL DATA:  Nausea and shortness of breath, patient this current pregnancy. EXAM: PORTABLE CHEST 1 VIEW COMPARISON:  None Available. FINDINGS: The cardiomediastinal contours are within normal limits. The lungs are clear. No pneumothorax or pleural effusion. No acute finding in the visualized skeleton. IMPRESSION: No acute cardiopulmonary process. Electronically Signed   By: Audie Pinto M.D.   On: 01/01/2022 13:06     MAU Course  Procedures NST:  Baseline: 150  Variability: moderate Accelerations: present  Decelerations: none Contractions: none  MDM Check CBC, CMP, Troponin, DDimer.  Fluid bolus.  Patient not improved. After fluid bolus - heartrate a little better. Still having some chest discomfort and SOB. Check CTA to r/o PE.  CTA shows no acute pulmonary process.  When relaying results to the patient, she disclosed that she has severe seasonal allergies and has not been taking her medication.   Assessment and Plan   1. [redacted] weeks gestation of pregnancy   2. SOB (shortness of breath)   3. Allergy, initial encounter    Shortness of breath may be related to uncontrolled allergies.  Will give albuterol.  Recommended patient restart her antihistamine of  her choice.  She reports that Claritin works well for her so recommended restarting that.  We will have patient follow-up with her primary OB.  Discharged home.  Patient currently stable  Truett Mainland 01/01/2022, 12:30 PM

## 2022-01-08 ENCOUNTER — Inpatient Hospital Stay (HOSPITAL_COMMUNITY)
Admission: AD | Admit: 2022-01-08 | Discharge: 2022-01-08 | Disposition: A | Discharge status: Home / Self Care | Payer: Medicaid Other | Attending: Obstetrics and Gynecology | Admitting: Obstetrics and Gynecology

## 2022-01-08 ENCOUNTER — Other Ambulatory Visit: Payer: Self-pay

## 2022-01-08 ENCOUNTER — Encounter (HOSPITAL_COMMUNITY): Payer: Self-pay | Admitting: Obstetrics and Gynecology

## 2022-01-08 DIAGNOSIS — E162 Hypoglycemia, unspecified: Secondary | ICD-10-CM | POA: Diagnosis not present

## 2022-01-08 DIAGNOSIS — O212 Late vomiting of pregnancy: Secondary | ICD-10-CM | POA: Diagnosis not present

## 2022-01-08 DIAGNOSIS — R109 Unspecified abdominal pain: Secondary | ICD-10-CM | POA: Diagnosis not present

## 2022-01-08 DIAGNOSIS — O99283 Endocrine, nutritional and metabolic diseases complicating pregnancy, third trimester: Secondary | ICD-10-CM | POA: Insufficient documentation

## 2022-01-08 DIAGNOSIS — O26893 Other specified pregnancy related conditions, third trimester: Secondary | ICD-10-CM | POA: Diagnosis not present

## 2022-01-08 DIAGNOSIS — Z3689 Encounter for other specified antenatal screening: Secondary | ICD-10-CM

## 2022-01-08 DIAGNOSIS — R519 Headache, unspecified: Secondary | ICD-10-CM

## 2022-01-08 DIAGNOSIS — Z3A36 36 weeks gestation of pregnancy: Secondary | ICD-10-CM | POA: Diagnosis not present

## 2022-01-08 DIAGNOSIS — O368130 Decreased fetal movements, third trimester, not applicable or unspecified: Secondary | ICD-10-CM | POA: Insufficient documentation

## 2022-01-08 DIAGNOSIS — O219 Vomiting of pregnancy, unspecified: Secondary | ICD-10-CM

## 2022-01-08 HISTORY — DX: Gestational diabetes mellitus in pregnancy, unspecified control: O24.419

## 2022-01-08 LAB — URINALYSIS, ROUTINE W REFLEX MICROSCOPIC
Bilirubin Urine: NEGATIVE
Glucose, UA: NEGATIVE mg/dL
Hgb urine dipstick: NEGATIVE
Ketones, ur: NEGATIVE mg/dL
Leukocytes,Ua: NEGATIVE
Nitrite: NEGATIVE
Protein, ur: 30 mg/dL — AB
Specific Gravity, Urine: 1.019 (ref 1.005–1.030)
pH: 5 (ref 5.0–8.0)

## 2022-01-08 LAB — LIPASE, BLOOD: Lipase: 34 U/L (ref 11–51)

## 2022-01-08 LAB — CBC
HCT: 31.6 % — ABNORMAL LOW (ref 36.0–46.0)
Hemoglobin: 10.7 g/dL — ABNORMAL LOW (ref 12.0–15.0)
MCH: 28.2 pg (ref 26.0–34.0)
MCHC: 33.9 g/dL (ref 30.0–36.0)
MCV: 83.2 fL (ref 80.0–100.0)
Platelets: 205 10*3/uL (ref 150–400)
RBC: 3.8 MIL/uL — ABNORMAL LOW (ref 3.87–5.11)
RDW: 12.6 % (ref 11.5–15.5)
WBC: 9.4 10*3/uL (ref 4.0–10.5)
nRBC: 0 % (ref 0.0–0.2)

## 2022-01-08 LAB — COMPREHENSIVE METABOLIC PANEL
ALT: 13 U/L (ref 0–44)
AST: 15 U/L (ref 15–41)
Albumin: 2.5 g/dL — ABNORMAL LOW (ref 3.5–5.0)
Alkaline Phosphatase: 134 U/L — ABNORMAL HIGH (ref 38–126)
Anion gap: 9 (ref 5–15)
BUN: 11 mg/dL (ref 6–20)
CO2: 20 mmol/L — ABNORMAL LOW (ref 22–32)
Calcium: 9.1 mg/dL (ref 8.9–10.3)
Chloride: 109 mmol/L (ref 98–111)
Creatinine, Ser: 0.65 mg/dL (ref 0.44–1.00)
GFR, Estimated: >60 mL/min (ref >60)
Glucose, Bld: 60 mg/dL — ABNORMAL LOW (ref 70–99)
Potassium: 4.6 mmol/L (ref 3.5–5.1)
Sodium: 138 mmol/L (ref 135–145)
Total Bilirubin: 0.4 mg/dL (ref 0.3–1.2)
Total Protein: 6 g/dL — ABNORMAL LOW (ref 6.5–8.1)

## 2022-01-08 LAB — GLUCOSE, CAPILLARY
Glucose-Capillary: 57 mg/dL — ABNORMAL LOW (ref 70–99)
Glucose-Capillary: 70 mg/dL (ref 70–99)

## 2022-01-08 LAB — PROTEIN / CREATININE RATIO, URINE
Creatinine, Urine: 196 mg/dL
Protein Creatinine Ratio: 0.15 mg/mg{Cre} (ref 0.00–0.15)
Total Protein, Urine: 30 mg/dL

## 2022-01-08 MED ORDER — ACETAMINOPHEN 500 MG PO TABS
1000.0000 mg | ORAL_TABLET | Freq: Once | ORAL | Status: AC
  Administered 2022-01-08: 1000 mg via ORAL
  Filled 2022-01-08: qty 2

## 2022-01-08 MED ORDER — CYCLOBENZAPRINE HCL 5 MG PO TABS
10.0000 mg | ORAL_TABLET | Freq: Once | ORAL | Status: AC
  Administered 2022-01-08: 10 mg via ORAL
  Filled 2022-01-08: qty 2

## 2022-01-08 NOTE — MAU Provider Note (Signed)
History     CSN: [id number]  Arrival date and time: 01/08/22 1352   None     Chief Complaint  Patient presents with   Abdominal Pain   Nausea   Headache   Decreased Fetal Movement   HPI Pamela Riggs is a 36 y.o. [id number] at [redacted]w[redacted]d who presents to MAU for various complaints.   Nausea/vomiting She reports nausea and vomiting since Friday. Reports constantly nauseous and vomits 1-2 times. She reports it feels "like there's a ball in throat", having a lot of heartburn. She does not take anything for either symptoms, reporting she just drinks milk and it help.   Abdominal pain Reporting pain around her right rib cage that started about 2-3 days ago, however worse today. Initially felt pain when she was lying in bed and rolled over to the other side. Pain is sharp and intermittent. She has not tried anything to relieve pain, just tries to get in a more comfortable position.  Elevated BP/headache She reports she took a BP at home and she thinks it was 140s/90s, however cannot remember. She is reporting a headache that has been ongoing since the last time she was here. She has not taken anything to relieve pain. She took Tylenol "a while ago", but it did not help so she did not take anymore. Currently rates headache 6-7/10. She denies vision changes, RUQ pain, or significant swelling  Decreased fetal movement She reports decreased in movement since last night, however has felt movement since she arrival to MAU.   OB History     Gravida  6   Para  2   Term  2   Preterm      AB  3   Living  2      SAB  3   IAB      Ectopic      Multiple      Live Births  2           Past Medical History:  Diagnosis Date   Gestational diabetes    Medical history non-contributory    Obesity    Vitamin D deficiency     Past Surgical History:  Procedure Laterality Date   DILATION AND CURETTAGE OF UTERUS      Family History  Problem Relation Age of Onset   Hypertension  Mother    Asthma Brother    Stroke Paternal Aunt    Diabetes Paternal Grandfather    Cancer Neg Hx     Social History   Tobacco Use   Smoking status: Never   Smokeless tobacco: Never  Vaping Use   Vaping Use: Never used  Substance Use Topics   Alcohol use: Never   Drug use: Never    Allergies: No Known Allergies  No medications prior to admission.   Review of Systems  Constitutional: Negative.   Respiratory: Negative.    Cardiovascular: Negative.   Gastrointestinal:  Positive for abdominal pain, nausea and vomiting.  Genitourinary:  Negative for vaginal bleeding.  Musculoskeletal: Negative.   Neurological:  Positive for headaches.   Physical Exam   Patient Vitals for the past 24 hrs:  BP Temp Temp src Pulse Resp SpO2 Height Weight  01/08/22 1645 114/66 -- -- 93 -- -- -- --  01/08/22 1631 (!) 111/54 -- -- 96 -- -- -- --  01/08/22 1616 (!) 125/51 -- -- 92 -- -- -- --  01/08/22 1601 133/66 -- -- (!) 127 -- -- -- --  01/08/22 1557  132/68 -- -- (!) 120 -- -- -- --  01/08/22 1546 114/82 -- -- (!) 115 -- -- -- --  01/08/22 1534 122/74 -- -- (!) 116 -- -- -- --  01/08/22 1430 120/76 -- -- (!) 124 -- -- -- --  01/08/22 1410 128/89 98.6 F (37 C) Oral (!) 102 18 97 % -- --  01/08/22 1404 -- -- -- -- -- -- 5\' 2"  (1.575 m) 107.8 kg   Physical Exam Vitals and nursing note reviewed.  Constitutional:      General: She is not in acute distress. Eyes:     Extraocular Movements: Extraocular movements intact.     Pupils: Pupils are equal, round, and reactive to light.  Cardiovascular:     Rate and Rhythm: Tachycardia present.  Pulmonary:     Effort: Pulmonary effort is normal.  Abdominal:     Palpations: Abdomen is soft.     Tenderness: There is no abdominal tenderness.     Comments: gravid  Musculoskeletal:        General: Normal range of motion.     Cervical back: Normal range of motion.  Skin:    General: Skin is warm and dry.  Neurological:     General: No focal  deficit present.     Mental Status: She is alert and oriented to person, place, and time.  Psychiatric:        Mood and Affect: Mood normal.        Behavior: Behavior normal.        Judgment: Judgment normal.    NST FHR: 135 bpm, moderate variability, +15x15 accels, no decels Toco: irregular  MAU Course  Procedures  MDM UA with 30 ketones, but otherwise unremarkable. CBC, CMP, Lipase, and UPCR ordered and all unremarkable. Glucose on CMP found to be 60.  1g Tylenol and 10mg  Flexeril ordered for headache I offered patient medication for nausea and heartburn, however declines as she does not currently have these symptoms While waiting for labs patient went to the bathroom and had an episode of dizziness and felt tingly. Patient visibly upset and breathing rapidly. She was also found to be diaphoretic. It was at this time CMP resulted back and glucose found to be 60. POCT CBG 57. Patient given crackers and apple juice. CBG rechecked after 20 minutes and found to be 70. Patient reporting symptoms have improved.   Reassessment of headache and patient now rating head 4/10 which is improvement from 7/10 on arrival. She was offered more medication however declines.  At this time, I have a low suspicion for pre-eclampsia as BP's are normotensive and labs are normal. Headache is improving. She is overall feeling much better since arrival.  NST is reactive and reassuring and patient is reporting normal active movement since arrival to MAU. Toco with irregular, infrequent contractions I educated patient on the importance of frequent meals/snacks, increasing protein and PO water intake. Hypoglycemia likely a contributing factor to patient's symptoms.   Assessment and Plan  [redacted] weeks gestation of pregnancy Decreased fetal movement NST reactive Headache in pregnancy Nausea/vomiting in pregnancy Hypoglycemia  - Discharge home in stable condition - Strict return precautions reviewed. Return to MAU  as needed for new/worsening symptoms - Keep OB appointment as scheduled   , CNM 01/08/2022, 7:23 PM

## 2022-01-08 NOTE — MAU Note (Signed)
Pamela Riggs is a 36 y.o. at [redacted]w[redacted]d here in MAU reporting: headache, swelling in feet/legs, nausea, and emesis. Also having right sided pain. No bleeding or LOF. DFM.   Onset of complaint: ongoing  Pain score: headache 5/10, abdominal pain 4/10  Vitals:   01/08/22 1410  BP: 128/89  Pulse: (!) 102  Resp: 18  Temp: 98.6 F (37 C)  SpO2: 97%     FHT:158  Lab orders placed from triage: UA

## 2022-01-08 NOTE — MAU Note (Signed)
Pt reports she had a strong ctx ans started to feel  dizzy. Then her face became tingly for a few seconds.  Pt is visibly upset and breathing fast. (See vs flow sheet). Had pt slow breathing down gave cool towel to forehead and some water to drink. Notified provider and she assessed pt. Pt feeling better after a few minutes.

## 2022-01-17 ENCOUNTER — Other Ambulatory Visit: Payer: Self-pay | Admitting: Obstetrics and Gynecology

## 2022-03-06 IMAGING — US US MFM OB FOLLOW-UP
1 series · 13 of 28 positions shown · non-contrast
Comparison: none

[Series 1: us mfm ob follow-up · 13 of 38 slices shown]
[im 2/38]
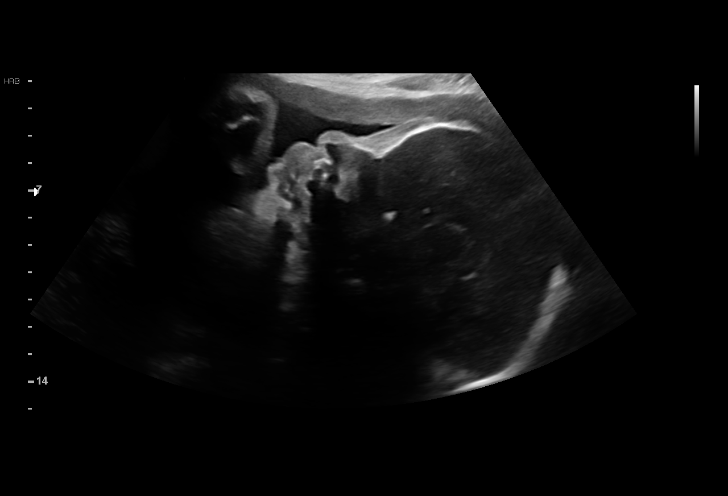
[im 5/38]
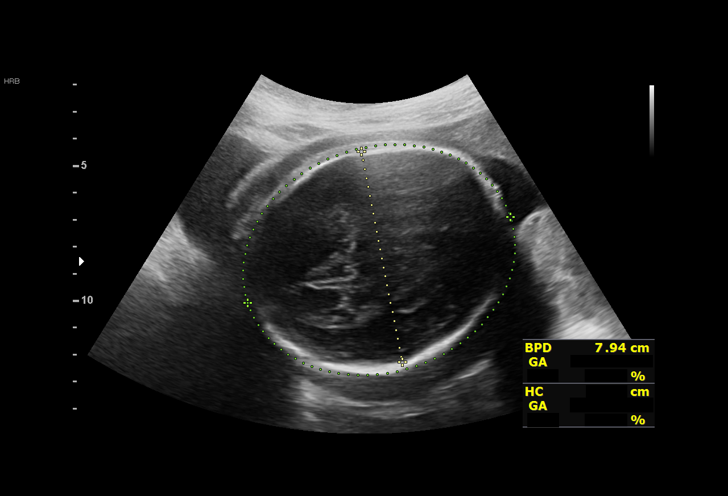
[im 7/38]
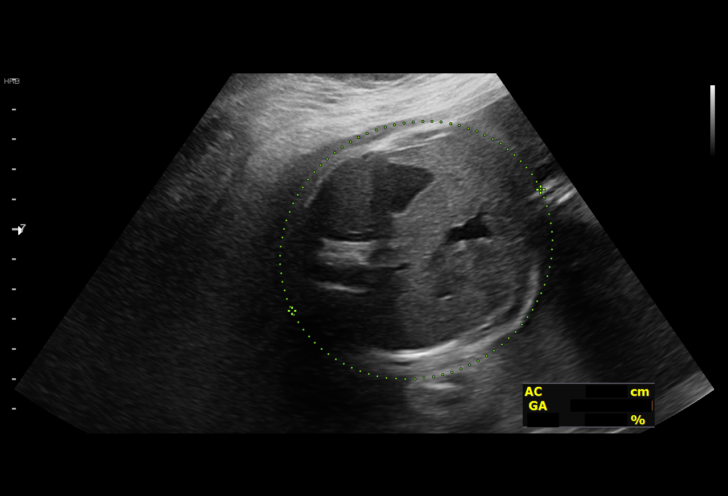
[im 10/38]
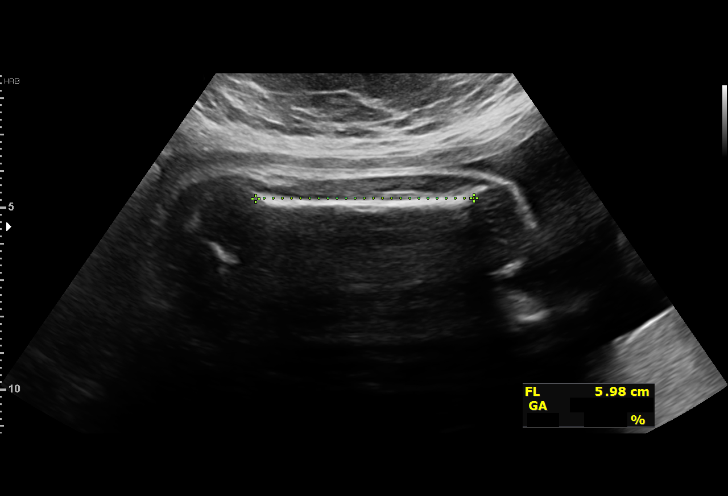
[im 13/38]
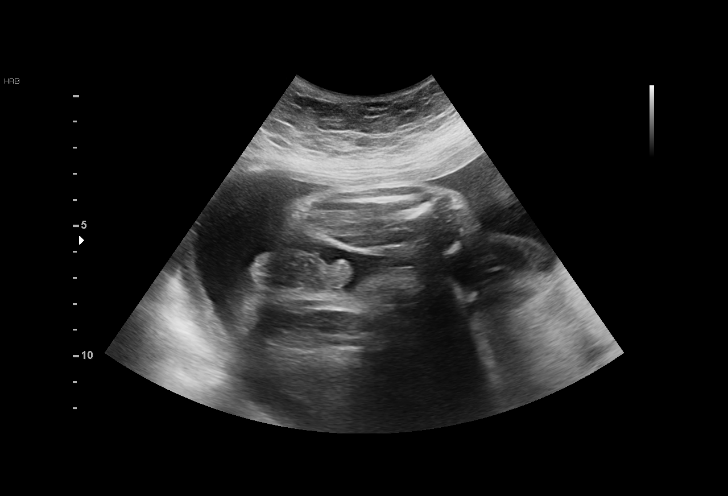
[im 16/38]
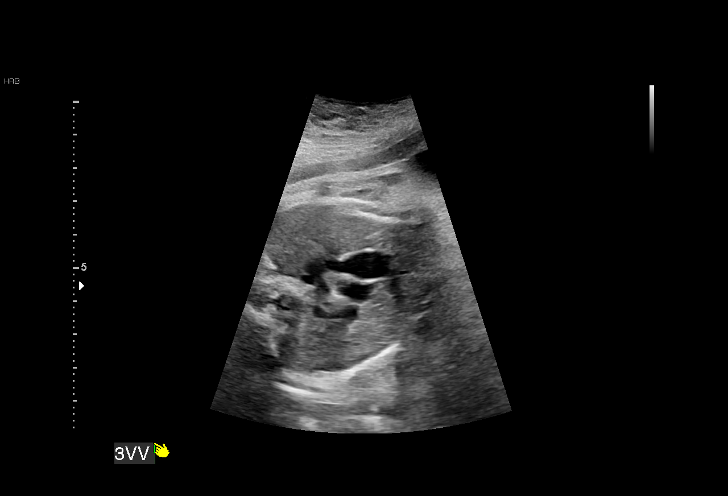
[im 20/38]
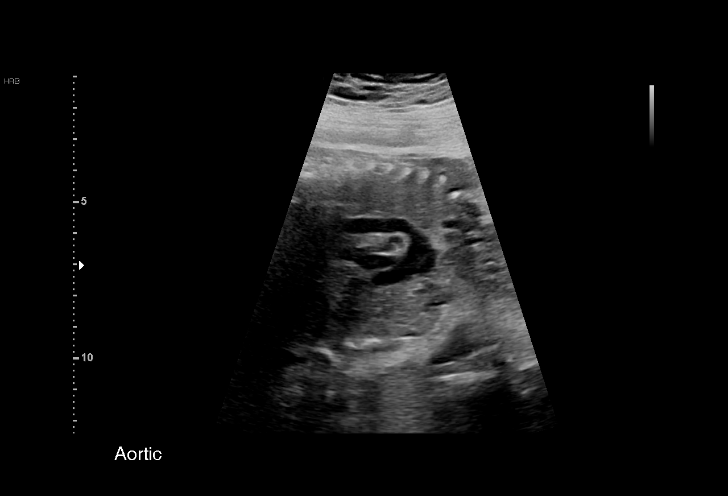
[im 22/38]
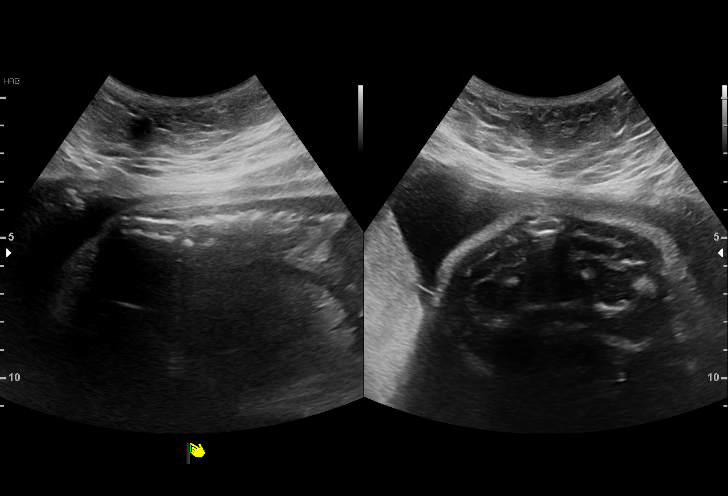
[im 25/38]
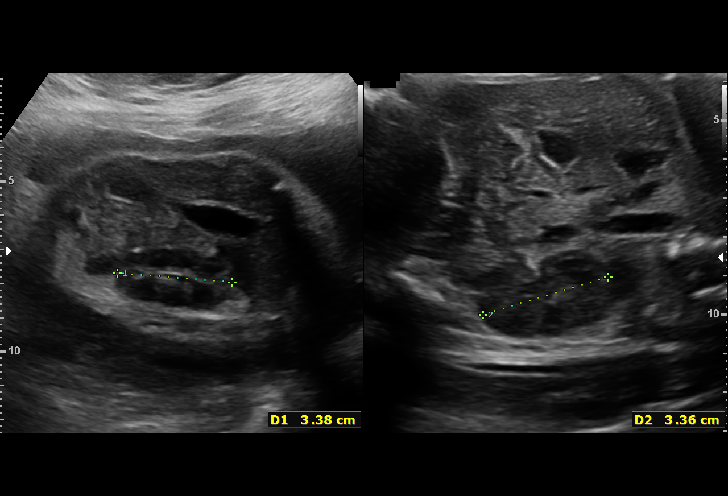
[im 28/38]
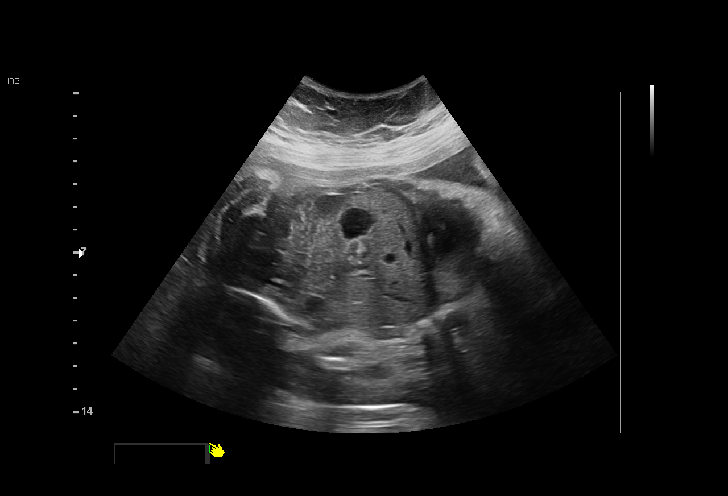
[im 31/38]
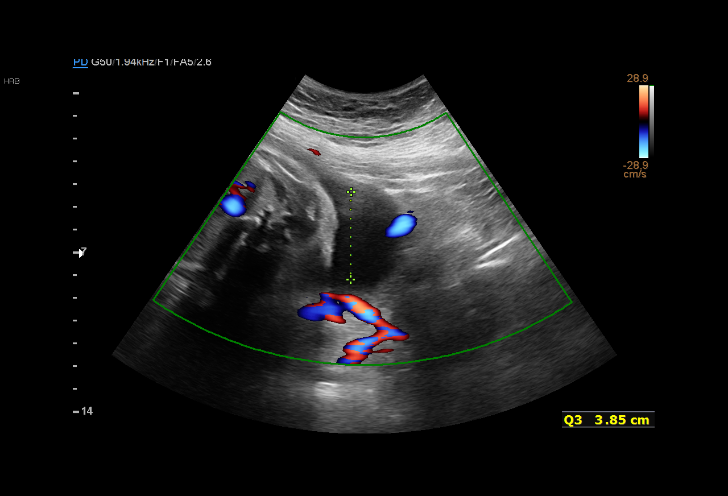
[im 33/38]
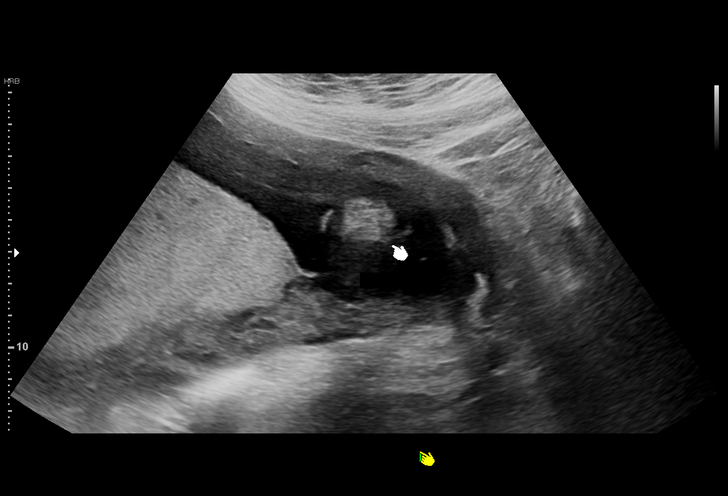
[im 36/38]
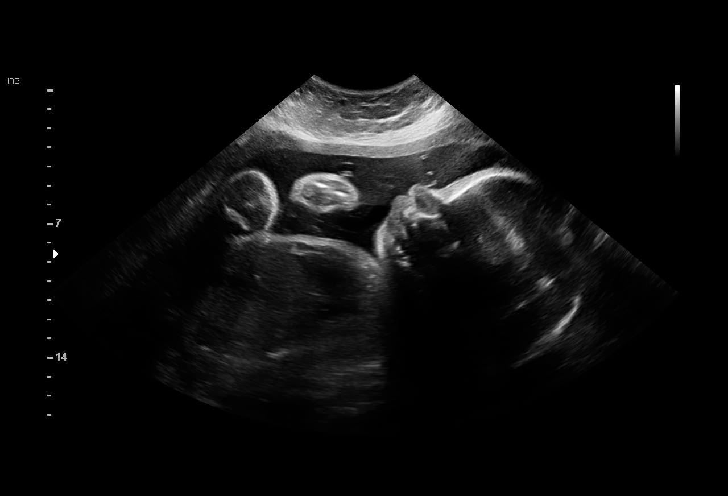

[13 of 28 positions shown; findings below may reference images not displayed]

Obstetrics &
                                                            Gynecology
                                                            [id number]
                                                            [place].
                   CNM

Indications

 Gestational diabetes in pregnancy, diet
 controlled
 Maternal morbid obesity (Pre Preg BMI 44)
 LR NIPS, Atypical sex chromosome; neg
 AFP
 Encounter for antenatal screening for
 malformations
 30 weeks gestation of pregnancy
Fetal Evaluation

 Num Of Fetuses:         1
 Cardiac Activity:       Observed
 Presentation:           Cephalic
 Placenta:               Posterior
 P. Cord Insertion:      Previously Visualized

 Amniotic Fluid
 AFI FV:      Within normal limits

 AFI Sum(cm)     %Tile       Largest Pocket(cm)
 15.95           57

 RUQ(cm)       RLQ(cm)       LUQ(cm)        LLQ(cm)
 2.2           3
Biometry

 BPD:      79.6  mm     G. Age:  32w 0d         90  %    CI:        75.23   %    70 - 86
                                                         FL/HC:      20.5   %    19.2 -
 HC:      291.1  mm     G. Age:  32w 1d         75  %    HC/AC:      1.05        0.99 -
 AC:      278.2  mm     G. Age:  31w 6d         91  %    FL/BPD:     74.9   %    71 - 87
 FL:       59.6  mm     G. Age:  31w 0d         66  %    FL/AC:      21.4   %    20 - 24

 Est. FW:    5258  gm           4 lb     90  %
OB History

 Gravidity:    3         Term:   1         SAB:   1
 Living:       1
Gestational Age

 Clinical EDD:  30w 0d                                        EDD:   05/17/20
 U/S Today:     31w 5d                                        EDD:   05/05/20
 Best:          30w 0d     Det. By:  Clinical EDD             EDD:   05/17/20
Anatomy

 Cranium:               Appears normal         Aortic Arch:            Appears normal
 Cavum:                 Appears normal         Ductal Arch:            Not well visualized
 Ventricles:            Appears normal         Diaphragm:              Appears normal
 Choroid Plexus:        Previously seen        Stomach:                Appears normal, left
                                                                       sided
 Cerebellum:            Previously seen        Abdomen:                Appears normal
 Posterior Fossa:       Previously seen        Abdominal Wall:         Previously seen
 Nuchal Fold:           Not applicable (>20    Cord Vessels:           Previously seen
                        wks GA)
 Face:                  Orbits and profile     Kidneys:                Appear normal
                        previously seen
 Lips:                  Previously seen        Bladder:                Appears normal
 Thoracic:              Appears normal         Spine:                  Not well visualized
 Heart:                 Appears normal         Upper Extremities:      Previously seen
                        (4CH, axis, and
                        situs)
 RVOT:                  Not well visualized    Lower Extremities:      Previously seen
 LVOT:                  Previously seen

 Other:  Fetus appears to be a male. Heels and LT 5th digit previously seen.
         Open hands previously visualized. Nasal bone previously visualized.
         Technically difficult due to maternal habitus and fetal position.
Cervix Uterus Adnexa

 Cervix
 Not visualized (advanced GA >05wks)
Comments

 This patient was seen for a follow up growth scan due to
 gestational diabetes.  She reports that her fasting fingerstick
 values have been elevated.  Due to this indication, she will be
 started on Metformin for treatment later today.
 She was informed that the fetal growth measures large for
 her gestational [AGE]th percentile).  There was normal
 amniotic fluid noted.
 Due to gestational diabetes that will be treated with
 Metformin, weekly fetal testing is recommended starting at
 around 32 weeks.
 A biophysical profile was scheduled in our office in 2 weeks.
 We will reassess the fetal growth again in 4 weeks.
